# Patient Record
Sex: Female | Born: 2000 | Hispanic: No | Marital: Single | Smoking: Never smoker
Health system: Southern US, Community
[De-identification: ages and names within clinical notes are randomized; demographics above are authoritative.]

## PROBLEM LIST (undated history)

## (undated) DIAGNOSIS — F32A Depression, unspecified: Secondary | ICD-10-CM

## (undated) DIAGNOSIS — F419 Anxiety disorder, unspecified: Secondary | ICD-10-CM

## (undated) DIAGNOSIS — T7840XA Allergy, unspecified, initial encounter: Secondary | ICD-10-CM

## (undated) HISTORY — DX: Allergy, unspecified, initial encounter: T78.40XA

## (undated) HISTORY — DX: Anxiety disorder, unspecified: F41.9

## (undated) HISTORY — DX: Depression, unspecified: F32.A

## (undated) HISTORY — PX: HERNIA REPAIR: SHX51

## (undated) HISTORY — PX: NEPHRECTOMY: SHX65

---
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
## 2022-04-05 DIAGNOSIS — N390 Urinary tract infection, site not specified: Secondary | ICD-10-CM | POA: Diagnosis not present

## 2022-06-09 DIAGNOSIS — N390 Urinary tract infection, site not specified: Secondary | ICD-10-CM | POA: Diagnosis not present

## 2022-06-09 DIAGNOSIS — Z3202 Encounter for pregnancy test, result negative: Secondary | ICD-10-CM | POA: Diagnosis not present

## 2022-06-17 DIAGNOSIS — N390 Urinary tract infection, site not specified: Secondary | ICD-10-CM | POA: Diagnosis not present

## 2022-07-08 ENCOUNTER — Ambulatory Visit: Payer: Self-pay | Admitting: *Deleted

## 2022-07-08 NOTE — Telephone Encounter (Signed)
Per agent: "Pt called saying she would like an appt to discuss digestion issues. She has been having bloating and some diarrhea lately.  I tried to schedule an appt  first available was next week."   CB#  [phone]    Chief Complaint: Diarrhea Symptoms: Reports diarrhea x 3 days over weekend, none past 2 days. Does still have abdominal cramping, 6/10, and bloating.States had UTI 3 weeks ago, completed course of antibiotics. States went to UC, stated "UTI was gone" but urine still malodorous and "Not really pain but uncomfortable when I urinate." Frequency: 5 days Pertinent Negatives: Patient denies  Disposition: '[]'$ ED /'[]'$ Urgent Care (no appt availability in office) / '[x]'$ Appointment(In office/virtual)/ '[]'$  [facility]/ '[]'$ Home Care/ '[]'$ Refused Recommended Disposition /'[]'$ [facility]/ '[]'$  Follow-up with PCP Additional Notes: Care advise provided, verbalizes understanding. Appt secured for tomorrow.    Reason for Disposition  [1] Recent antibiotic therapy (i.e., within last 2 months) AND [2] diarrhea present > 3 days since antibiotic was stopped  Answer Assessment - Initial Assessment Questions 1. DIARRHEA SEVERITY: "How bad is the diarrhea?" "How many more stools have you had in the past 24 hours than normal?"    - NO DIARRHEA (SCALE 0)   - MILD (SCALE 1-3): Few loose or mushy BMs; increase of 1-3 stools over normal daily number of stools; mild increase in ostomy output.   -  MODERATE (SCALE 4-7): Increase of 4-6 stools daily over normal; moderate increase in ostomy output. * SEVERE (SCALE 8-10; OR 'WORST POSSIBLE'): Increase of 7 or more stools daily over normal; moderate increase in ostomy output; incontinence.      2. ONSET: "When did the diarrhea begin?"      Over weekend 3. BM CONSISTENCY: "How loose or watery is the diarrhea?"      *No Answer* 4. VOMITING: "Are you also vomiting?" If Yes, ask: "How many times in the past 24 hours?"       5. ABDOMINAL PAIN: "Are  you having any abdominal pain?" If Yes, ask: "What does it feel like?" (e.g., crampy, dull, intermittent, constant)      Cramping before BMs, gassy. 6. ABDOMINAL PAIN SEVERITY: If present, ask: "How bad is the pain?"  (e.g., Scale 1-10; mild, moderate, or severe)   - MILD (1-3): doesn't interfere with normal activities, abdomen soft and not tender to touch    - MODERATE (4-7): interferes with normal activities or awakens from sleep, abdomen tender to touch    - SEVERE (8-10): excruciating pain, doubled over, unable to do any normal activities       6/10 when occurs 7. ORAL INTAKE: If vomiting, "Have you been able to drink liquids?" "How much liquids have you had in the past 24 hours?"     NA 8. HYDRATION: "Any signs of dehydration?" (e.g., dry mouth [not just dry lips], too weak to stand, dizziness, new weight loss) "When did you last urinate?"     NA 9. EXPOSURE: "Have you traveled to a foreign country recently?" "Have you been exposed to anyone with diarrhea?" "Could you have eaten any food that was spoiled?"     no 10. ANTIBIOTIC USE: "Are you taking antibiotics now or have you taken antibiotics in the past 2 months?"       3 weeks ago, for 1 week. UC said UTI "one." 11. OTHER SYMPTOMS: "Do you have any other symptoms?" (e.g., fever, blood in stool)       "Bloating" Malodorous.  Protocols used: [facility]

## 2022-07-09 ENCOUNTER — Other Ambulatory Visit: Payer: Self-pay | Admitting: Unknown Physician Specialty

## 2022-07-09 ENCOUNTER — Ambulatory Visit (INDEPENDENT_AMBULATORY_CARE_PROVIDER_SITE_OTHER): Payer: Medicaid Other | Admitting: Unknown Physician Specialty

## 2022-07-09 ENCOUNTER — Encounter: Payer: Self-pay | Admitting: Unknown Physician Specialty

## 2022-07-09 VITALS — BP 106/71 | HR 80 | Temp 98.5°F | Ht 61.18 in | Wt 172.0 lb

## 2022-07-09 DIAGNOSIS — R8290 Unspecified abnormal findings in urine: Secondary | ICD-10-CM | POA: Diagnosis not present

## 2022-07-09 DIAGNOSIS — F339 Major depressive disorder, recurrent, unspecified: Secondary | ICD-10-CM | POA: Diagnosis not present

## 2022-07-09 DIAGNOSIS — L700 Acne vulgaris: Secondary | ICD-10-CM

## 2022-07-09 DIAGNOSIS — K5989 Other specified functional intestinal disorders: Secondary | ICD-10-CM

## 2022-07-09 DIAGNOSIS — L709 Acne, unspecified: Secondary | ICD-10-CM | POA: Insufficient documentation

## 2022-07-09 LAB — URINALYSIS, ROUTINE W REFLEX MICROSCOPIC
Bilirubin, UA: NEGATIVE
Glucose, UA: NEGATIVE
Ketones, UA: NEGATIVE
Nitrite, UA: NEGATIVE
Protein,UA: NEGATIVE
RBC, UA: NEGATIVE
Specific Gravity, UA: 1.025 (ref 1.005–1.030)
Urobilinogen, Ur: 0.2 mg/dL (ref 0.2–1.0)
pH, UA: 5.5 (ref 5.0–7.5)

## 2022-07-09 LAB — MICROSCOPIC EXAMINATION: RBC, Urine: NONE SEEN /hpf (ref 0–2)

## 2022-07-09 MED ORDER — CLINDAMYCIN PHOSPHATE 1 % EX LOTN: TOPICAL_LOTION | Freq: Two times a day (BID) | CUTANEOUS | 0 refills | Status: DC

## 2022-07-09 NOTE — Patient Instructions (Signed)
Take Align if bowel problems don't continue to improve

## 2022-07-09 NOTE — Assessment & Plan Note (Signed)
Acne has been a problem for a while and mostly concerned about her back.  Will start Cleocin here and refer to dermatology for further care

## 2022-07-09 NOTE — Assessment & Plan Note (Signed)
Pt states she has an appt with [facility].  Also has a lot of symptoms of hypomania and SSRIs probably not indicated.  Will get a work-up with them and continue to follow
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
## 2022-07-10 ENCOUNTER — Telehealth: Payer: Self-pay

## 2022-07-10 MED ORDER — ADAPALENE 0.3 % EX GEL: 1.0000 | Freq: Every day | CUTANEOUS | 0 refills | Status: DC

## 2022-07-10 NOTE — Telephone Encounter (Signed)
-----   Message from Jon Billings, NP sent at 07/10/2022  3:21 PM EDT ----- Can we check on her clindamycin at the pharmacy?

## 2022-07-10 NOTE — Telephone Encounter (Signed)
Requested medication (s) are due for refill today:   PA denied  Requested medication (s) are on the active medication list:   Yes  Future visit scheduled:   Yes 7/27 with Santiago Glad   Last ordered: 07/09/2022 60 ml, 0 refills by Kathrine Haddock  Returned because no protocol assigned to this medication and the PA was denied.   See note for alternatives from pharmacy.   Requested Prescriptions  Pending Prescriptions Disp Refills   clindamycin (CLEOCIN T) 1 % external solution [Pharmacy Med Name: CLINDAMYCIN PH 1% SOLUTION]  0    Sig: Apply topically 2 (two) times daily.     Off-Protocol Failed - 07/09/2022  6:34 PM      Failed - Medication not assigned to a protocol, review manually.      Passed - Valid encounter within last 12 months    Recent Outpatient Visits           Yesterday Abnormal urine odor   [facility] Kathrine Haddock, NP   1 year ago Difficulty concentrating   [place], NP   2 years ago Chronic midline low back pain with bilateral sciatica   [facility], [place]       Future Appointments             In 2 weeks Jon Billings, NP [facility], [place]

## 2022-07-10 NOTE — Telephone Encounter (Signed)
Spoke with pharmacy staff regarding clindamycin lotion. Pharmacist is saying PA for lotion was denied, is there something else we could send? Pharmacist told me they do not see preferred meds on their end.

## 2022-07-10 NOTE — Telephone Encounter (Signed)
Attemped to call patient regarding new rx sent to pharmacy. Patient did not answer. Unable to lvm due to box being full   OK for PEC/Nurse Triage to give results for patient if he calls back.

## 2022-07-10 NOTE — Telephone Encounter (Signed)
Pt returned call. Message read, verbalizes understanding.

## 2022-07-11 LAB — URINE CULTURE

## 2022-07-15 DIAGNOSIS — F411 Generalized anxiety disorder: Secondary | ICD-10-CM | POA: Diagnosis not present

## 2022-07-24 ENCOUNTER — Encounter: Payer: Self-pay | Admitting: Nurse Practitioner

## 2022-07-24 ENCOUNTER — Ambulatory Visit (INDEPENDENT_AMBULATORY_CARE_PROVIDER_SITE_OTHER): Payer: Medicaid Other | Admitting: Nurse Practitioner

## 2022-07-24 VITALS — BP 121/67 | HR 83 | Temp 99.1°F | Ht 60.83 in | Wt 168.0 lb

## 2022-07-24 DIAGNOSIS — F339 Major depressive disorder, recurrent, unspecified: Secondary | ICD-10-CM

## 2022-07-24 DIAGNOSIS — Z136 Encounter for screening for cardiovascular disorders: Secondary | ICD-10-CM

## 2022-07-24 DIAGNOSIS — F419 Anxiety disorder, unspecified: Secondary | ICD-10-CM

## 2022-07-24 DIAGNOSIS — Z113 Encounter for screening for infections with a predominantly sexual mode of transmission: Secondary | ICD-10-CM | POA: Diagnosis not present

## 2022-07-24 DIAGNOSIS — Z0000 Encounter for general adult medical examination without abnormal findings: Secondary | ICD-10-CM | POA: Diagnosis not present

## 2022-07-24 DIAGNOSIS — Z23 Encounter for immunization: Secondary | ICD-10-CM | POA: Diagnosis not present

## 2022-07-24 LAB — MICROSCOPIC EXAMINATION: RBC, Urine: NONE SEEN /hpf (ref 0–2)

## 2022-07-24 LAB — URINALYSIS, ROUTINE W REFLEX MICROSCOPIC
Bilirubin, UA: NEGATIVE
Glucose, UA: NEGATIVE
Nitrite, UA: NEGATIVE
RBC, UA: NEGATIVE
Specific Gravity, UA: 1.02 (ref 1.005–1.030)
Urobilinogen, Ur: 0.2 mg/dL (ref 0.2–1.0)
pH, UA: 6 (ref 5.0–7.5)

## 2022-07-24 MED ORDER — VALACYCLOVIR HCL 1 G PO TABS: 1000.0000 mg | ORAL_TABLET | Freq: Two times a day (BID) | ORAL | 1 refills | Status: DC

## 2022-07-24 NOTE — Assessment & Plan Note (Signed)
Chronic.  Controlled.  Continue with current medication regimen of Zoloft '25mg'$ .  Labs ordered today.  Return to clinic in 6 months for reevaluation.  Call sooner if concerns arise.

## 2022-07-24 NOTE — Progress Notes (Signed)
BP 121/67   Pulse 83   Temp 99.1 F (37.3 C) (Oral)   Ht 5' 0.83" (1.545 m)   Wt 168 lb (76.2 kg)   SpO2 98%   BMI 31.92 kg/m    Subjective:    Patient ID: Joy Jackson, female    DOB: 09-Feb-2001, 21 y.o.   MRN: [id number]  HPI: Joy Jackson is a 21 y.o. female presenting on 07/24/2022 for comprehensive medical examination. Current medical complaints include:none  She currently lives with: Menopausal Symptoms: no  MOOD Patient states she feels like her mood is much better.  She went to [facility] and was put back on Zoloft which is helping her a lot.  Denies SI.  Depression Screen done today and results listed below:     07/24/2022    2:49 PM 07/09/2022   11:24 AM 07/08/2021    3:55 PM 06/20/2020    3:12 PM  Depression screen PHQ 2/9  Decreased Interest 2 3 0 2  Down, Depressed, Hopeless 0 '3 1 2  '$ PHQ - 2 Score '2 6 1 4  '$ Altered sleeping 0 3  2  Tired, decreased energy '1 2  2  '$ Change in appetite '3 2  2  '$ Feeling bad or failure about yourself  0 3  2  Trouble concentrating 1 3  0  Moving slowly or fidgety/restless 0 3  0  Suicidal thoughts 0 1  0  PHQ-9 Score '7 23  12  '$ Difficult doing work/chores Somewhat difficult Very difficult      The patient does not have a history of falls. I did complete a risk assessment for falls. A plan of care for falls was documented.   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   clindamycin (CLEOCIN-T) 1 % lotion Apply topically 2 (two) times daily.   sertraline (ZOLOFT) 25 MG tablet Take 25 mg by mouth daily.   valACYclovir (VALTREX) 1000 MG tablet Take 1 tablet by mouth 2 (two) times daily.   No current facility-administered medications on file prior to visit.    Allergies:  No Known Allergies  Social History:  Social History   Socioeconomic History   Marital status: Single    Spouse name: Not on file   Number of children: Not on file   Years of education: Not on file   Highest education level: Not on file  Occupational History   Not on file  Tobacco Use   Smoking status: Never   Smokeless tobacco: Never   Tobacco comments:    mother vapes  Vaping Use   Vaping Use: Every day  Substance and Sexual Activity   Alcohol use: Not Currently   Drug use: Yes    Types: Marijuana   Sexual activity: Yes  Birth control/protection: I.U.D.  Other Topics Concern   Not on file  Social History Narrative   Not on file   Social Determinants of Health   Financial Resource Strain: Not on file  Food Insecurity: Not on file  Transportation Needs: Not on file  Physical Activity: Not on file  Stress: Not on file  Social Connections: Not on file  Intimate Partner Violence: Not on file   Social History   Tobacco Use  Smoking Status Never  Smokeless Tobacco Never  Tobacco Comments   mother vapes   Social History   Substance and Sexual Activity  Alcohol Use Not Currently    Family History:  Family History  Problem Relation Age of Onset   Healthy Mother    Other Father        unknown medical history   ADD / ADHD Brother    Asthma Brother    Heart attack Maternal Grandfather     Past medical history, surgical history, medications, allergies, family history and social history reviewed with patient today and changes made to appropriate areas of the chart.    Review of Systems  Psychiatric/Behavioral:  Positive for depression. Negative for suicidal ideas. The patient is nervous/anxious.    All other ROS negative except what is listed above and in the HPI.      Objective:    BP 121/67   Pulse 83   Temp 99.1 F (37.3 C) (Oral)   Ht 5' 0.83" (1.545 m)   Wt 168 lb (76.2 kg)   SpO2 98%   BMI 31.92 kg/m   Wt Readings from Last 3 Encounters:  07/24/22 168 lb (76.2 kg) (91 %, Z= 1.36)*  07/09/22 172 lb (78 kg) (93 %, Z= 1.45)*  07/08/21 160 lb 4 oz (72.7 kg) (89 %, Z= 1.25)*   * Growth percentiles are based on CDC (Girls, 2-20 Years) data.    Physical Exam Vitals and nursing note reviewed.  Constitutional:      General: She is awake. She is not in acute distress.    Appearance: Normal appearance. She is well-developed and normal weight. She is not ill-appearing.  HENT:     Head: Normocephalic and atraumatic.     Right Ear: Hearing, tympanic membrane, ear canal and external ear normal. No drainage.     Left Ear: Hearing, tympanic membrane, ear canal and external ear normal. No drainage.     Nose: Nose normal.     Right Sinus: No maxillary sinus tenderness or frontal sinus tenderness.     Left Sinus: No maxillary sinus tenderness or frontal sinus tenderness.     Mouth/Throat:     Mouth: Mucous membranes are moist.     Pharynx: Oropharynx is clear. Uvula midline. No pharyngeal swelling, oropharyngeal exudate or posterior oropharyngeal erythema.  Eyes:     General: Lids are normal.        Right eye: No discharge.        Left eye: No discharge.     Extraocular Movements: Extraocular movements intact.     Conjunctiva/sclera: Conjunctivae normal.     Pupils: Pupils are equal, round, and reactive to light.     Visual Fields: Right eye visual fields normal and left eye visual fields normal.  Neck:     Thyroid: No thyromegaly.     Vascular: No carotid bruit.     Trachea: Trachea normal.  Cardiovascular:     Rate and Rhythm: Normal  rate and regular rhythm.  Heart sounds: Normal heart sounds. No murmur heard.    No gallop.  Pulmonary:     Effort: Pulmonary effort is normal. No accessory muscle usage or respiratory distress.     Breath sounds: Normal breath sounds.  Chest:  Breasts:    Right: Normal.     Left: Normal.  Abdominal:     General: Bowel sounds are normal.     Palpations: Abdomen is soft. There is no hepatomegaly or splenomegaly.     Tenderness: There is no abdominal tenderness.  Musculoskeletal:        General: Normal range of motion.     Cervical back: Normal range of motion and neck supple.     Right lower leg: No edema.     Left lower leg: No edema.  Lymphadenopathy:     Head:     Right side of head: No submental, submandibular, tonsillar, preauricular or posterior auricular adenopathy.     Left side of head: No submental, submandibular, tonsillar, preauricular or posterior auricular adenopathy.     Cervical: No cervical adenopathy.     Upper Body:     Right upper body: No supraclavicular, axillary or pectoral adenopathy.     Left upper body: No supraclavicular, axillary or pectoral adenopathy.  Skin:    General: Skin is warm and dry.     Capillary Refill: Capillary refill takes less than 2 seconds.     Findings: No rash.  Neurological:     Mental Status: She is alert and oriented to person, place, and time.     Gait: Gait is intact.     Deep Tendon Reflexes: Reflexes are normal and symmetric.     Reflex Scores:      Brachioradialis reflexes are 2+ on the right side and 2+ on the left side.      Patellar reflexes are 2+ on the right side and 2+ on the left side. Psychiatric:        Attention and Perception: Attention normal.        Mood and Affect: Mood normal.        Speech: Speech normal.        Behavior: Behavior normal. Behavior is cooperative.        Thought Content: Thought content normal.        Judgment: Judgment normal.     Results for orders placed or performed in visit  on 07/09/22  Urine Culture   Specimen: Urine   UR  Result Value Ref Range   Urine Culture, Routine Final report    Organism ID, Bacteria Comment   Microscopic Examination   Urine  Result Value Ref Range   WBC, UA 0-5 0 - 5 /hpf   RBC, Urine None seen 0 - 2 /hpf   Epithelial Cells (non renal) 0-10 0 - 10 /hpf   Mucus, UA Present (A) Not Estab.   Bacteria, UA Moderate (A) None seen/Few  Urinalysis, Routine w reflex microscopic  Result Value Ref Range   Specific Gravity, UA 1.025 1.005 - 1.030   pH, UA 5.5 5.0 - 7.5   Color, UA Yellow Yellow   Appearance Ur Cloudy (A) Clear   Leukocytes,UA Trace (A) Negative   Protein,UA Negative Negative/Trace   Glucose, UA Negative Negative   Ketones, UA Negative Negative   RBC, UA Negative Negative   Bilirubin, UA Negative Negative   Urobilinogen, Ur 0.2 0.2 - 1.0 mg/dL   Nitrite, UA Negative Negative   Microscopic Examination See below:  Assessment & Plan:   Problem List Items Addressed This Visit       Other   Anxiety    Chronic.  Controlled.  Continue with current medication regimen of Zoloft '25mg'$ .  Labs ordered today.  Return to clinic in 6 months for reevaluation.  Call sooner if concerns arise.        Relevant Medications   sertraline (ZOLOFT) 25 MG tablet   Depression, recurrent (HCC)    Chronic.  Controlled.  Continue with current medication regimen of Zoloft '25mg'$ .  Labs ordered today.  Return to clinic in 6 months for reevaluation.  Call sooner if concerns arise.        Relevant Medications   sertraline (ZOLOFT) 25 MG tablet   Other Visit Diagnoses     Annual physical exam    -  Primary   Health maintenance reviewed during visit today.  Labs ordered. HPV given.    Relevant Orders   CBC with Differential/Platelet   Comprehensive metabolic panel   Lipid panel   TSH   Urinalysis, Routine w reflex microscopic   Screening for ischemic heart disease       Relevant Orders   Lipid panel   Need for HPV  vaccination       Relevant Orders   HPV 9-valent vaccine,Recombinat   Routine screening for STI (sexually transmitted infection)       Relevant Orders   HIV Antibody (routine testing w rflx)   Hepatitis C Antibody   Chlamydia/Gonococcus/Trichomonas, NAA([facility])        Follow up plan: No follow-ups on file.   LABORATORY TESTING:  - Pap smear: not applicable  IMMUNIZATIONS:   - Tdap: Tetanus vaccination status reviewed: last tetanus booster within 10 years. - Influenza: Postponed to flu season - Pneumovax: Not applicable - Prevnar: Not applicable - COVID: Not applicable - HPV: Administered today - Shingrix vaccine: Not applicable  SCREENING: -Mammogram: Not applicable  - Colonoscopy: Not applicable  - Bone Density: Not applicable  -Hearing Test: Not applicable  -Spirometry: Not applicable   PATIENT COUNSELING:   Advised to take 1 mg of folate supplement per day if capable of pregnancy.   Sexuality: Discussed sexually transmitted diseases, partner selection, use of condoms, avoidance of unintended pregnancy  and contraceptive alternatives.   Advised to avoid cigarette smoking.  I discussed with the patient that most people either abstain from alcohol or drink within safe limits (<=14/week and <=4 drinks/occasion for males, <=7/weeks and <= 3 drinks/occasion for females) and that the risk for alcohol disorders and other health effects rises proportionally with the number of drinks per week and how often a drinker exceeds daily limits.  Discussed cessation/primary prevention of drug use and availability of treatment for abuse.   Diet: Encouraged to adjust caloric intake to maintain  or achieve ideal body weight, to reduce intake of dietary saturated fat and total fat, to limit sodium intake by avoiding high sodium foods and not adding table salt, and to maintain adequate dietary potassium and calcium preferably from fresh fruits, vegetables, and low-fat dairy products.     stressed the importance of regular exercise  Injury prevention: Discussed safety belts, safety helmets, smoke detector, smoking near bedding or upholstery.   Dental health: Discussed importance of regular tooth brushing, flossing, and dental visits.    NEXT PREVENTATIVE PHYSICAL DUE IN 1 YEAR. No follow-ups on file.

## 2022-07-25 LAB — COMPREHENSIVE METABOLIC PANEL
ALT: 7 IU/L (ref 0–32)
AST: 16 IU/L (ref 0–40)
Albumin/Globulin Ratio: 2 (ref 1.2–2.2)
Albumin: 4.6 g/dL (ref 4.0–5.0)
Alkaline Phosphatase: 77 IU/L (ref 42–106)
BUN/Creatinine Ratio: 11 (ref 9–23)
BUN: 10 mg/dL (ref 6–20)
Bilirubin Total: 0.4 mg/dL (ref 0.0–1.2)
CO2: 22 mmol/L (ref 20–29)
Calcium: 9.4 mg/dL (ref 8.7–10.2)
Chloride: 103 mmol/L (ref 96–106)
Creatinine, Ser: 0.93 mg/dL (ref 0.57–1.00)
Globulin, Total: 2.3 g/dL (ref 1.5–4.5)
Glucose: 78 mg/dL (ref 70–99)
Potassium: 3.8 mmol/L (ref 3.5–5.2)
Sodium: 141 mmol/L (ref 134–144)
Total Protein: 6.9 g/dL (ref 6.0–8.5)
eGFR: 91 mL/min/{1.73_m2} (ref >59)

## 2022-07-25 LAB — LIPID PANEL
Chol/HDL Ratio: 1.7 ratio (ref 0.0–4.4)
Cholesterol, Total: 123 mg/dL (ref 100–169)
HDL: 73 mg/dL (ref >39)
LDL Chol Calc (NIH): 36 mg/dL (ref 0–109)
Triglycerides: 69 mg/dL (ref 0–89)
VLDL Cholesterol Cal: 14 mg/dL (ref 5–40)

## 2022-07-25 LAB — CBC WITH DIFFERENTIAL/PLATELET
Basophils Absolute: 0.1 10*3/uL (ref 0.0–0.2)
Basos: 1 %
EOS (ABSOLUTE): 0.1 10*3/uL (ref 0.0–0.4)
Eos: 2 %
Hematocrit: 37.3 % (ref 34.0–46.6)
Hemoglobin: 12.7 g/dL (ref 11.1–15.9)
Immature Grans (Abs): 0 10*3/uL (ref 0.0–0.1)
Immature Granulocytes: 0 %
Lymphocytes Absolute: 2.1 10*3/uL (ref 0.7–3.1)
Lymphs: 28 %
MCH: 30 pg (ref 26.6–33.0)
MCHC: 34 g/dL (ref 31.5–35.7)
MCV: 88 fL (ref 79–97)
Monocytes Absolute: 0.6 10*3/uL (ref 0.1–0.9)
Monocytes: 8 %
Neutrophils Absolute: 4.5 10*3/uL (ref 1.4–7.0)
Neutrophils: 61 %
Platelets: 322 10*3/uL (ref 150–450)
RBC: 4.23 x10E6/uL (ref 3.77–5.28)
RDW: 12.5 % (ref 11.7–15.4)
WBC: 7.3 10*3/uL (ref 3.4–10.8)

## 2022-07-25 LAB — TSH: TSH: 1.76 u[IU]/mL (ref 0.450–4.500)

## 2022-07-25 LAB — HEPATITIS C ANTIBODY: Hep C Virus Ab: NONREACTIVE

## 2022-07-25 LAB — HIV ANTIBODY (ROUTINE TESTING W REFLEX): HIV Screen 4th Generation wRfx: NONREACTIVE

## 2022-07-25 NOTE — Progress Notes (Signed)
Hi Joniqua. It was nice to see you yesterday.  Your lab work looks good.  No concerns at this time. Continue with your current medication regimen.  Follow up as discussed.  Please let me know if you have any questions.

## 2022-07-28 LAB — CHLAMYDIA/GONOCOCCUS/TRICHOMONAS, NAA
Chlamydia by NAA: NEGATIVE
Gonococcus by NAA: NEGATIVE
Trich vag by NAA: NEGATIVE

## 2022-07-29 ENCOUNTER — Encounter: Payer: Self-pay | Admitting: Nurse Practitioner

## 2022-08-28 DIAGNOSIS — F411 Generalized anxiety disorder: Secondary | ICD-10-CM | POA: Diagnosis not present

## 2022-08-31 DIAGNOSIS — N898 Other specified noninflammatory disorders of vagina: Secondary | ICD-10-CM | POA: Diagnosis not present

## 2022-08-31 DIAGNOSIS — N760 Acute vaginitis: Secondary | ICD-10-CM | POA: Diagnosis not present

## 2022-10-27 DIAGNOSIS — F3341 Major depressive disorder, recurrent, in partial remission: Secondary | ICD-10-CM | POA: Diagnosis not present

## 2022-10-27 DIAGNOSIS — F411 Generalized anxiety disorder: Secondary | ICD-10-CM | POA: Diagnosis not present

## 2022-11-24 DIAGNOSIS — N941 Unspecified dyspareunia: Secondary | ICD-10-CM | POA: Diagnosis not present

## 2022-11-24 DIAGNOSIS — R300 Dysuria: Secondary | ICD-10-CM | POA: Diagnosis not present

## 2022-11-24 DIAGNOSIS — F419 Anxiety disorder, unspecified: Secondary | ICD-10-CM | POA: Diagnosis not present

## 2022-11-24 DIAGNOSIS — J45998 Other asthma: Secondary | ICD-10-CM | POA: Diagnosis not present

## 2022-12-16 ENCOUNTER — Ambulatory Visit: Payer: Self-pay

## 2022-12-16 NOTE — Telephone Encounter (Signed)
  Chief Complaint: Requesting medication Symptoms: Sore throat Frequency: last night Pertinent Negatives: Patient denies any other s/s Disposition: '[]'$ ED /'[]'$ Urgent Care (no appt availability in office) / '[]'$ Appointment(In office/virtual)/ '[]'$  [place] [facility]/ '[]'$ Home Care/ '[]'$ Refused Recommended Disposition /'[]'$ [place] [facility]/ '[x]'$  Follow-up with PCP Additional Notes: Pt states that her mother tested + for flu at [facility] and was given Tamiflu.  Pt  and brother currently have sore throats. Both would like to get an Rx called in for her and her brother as they are going out of town.   Please advise.    Summary: requesting Tamaflu   Patients mother Lavell Ridings states that she just left urgent care and she tested positive for the flu and the Urgent Care doctor told her to call her PCP for her to order Tamaflu for the patient. Patient is experiencing cough and sore throat. Please advise     Reason for Disposition  Prescription request for new medicine (not a refill)  Answer Assessment - Initial Assessment Questions 1. DRUG NAME: "What medicine do you need to have refilled?"     Tamiflu 2. REFILLS REMAINING: "How many refills are remaining?" (Note: The label on the medicine or pill bottle will show how many refills are remaining. If there are no refills remaining, then a renewal may be needed.)     no 3. EXPIRATION DATE: "What is the expiration date?" (Note: The label states when the prescription will expire, and thus can no longer be refilled.)     no 4. PRESCRIBING HCP: "Who prescribed it?" Reason: If prescribed by specialist, call should be referred to that group.     no 5. SYMPTOMS: "Do you have any symptoms?"     Sore throat 6. PREGNANCY: "Is there any chance that you are pregnant?" "When was your last menstrual period?"  Protocols used: Medication Refill and Renewal Call-A-AH

## 2022-12-16 NOTE — Telephone Encounter (Signed)
Spoke with patient and let her know that an appointment is needed for medications. She verbalized understanding and stated she thinks they will be okay

## 2022-12-18 ENCOUNTER — Telehealth (INDEPENDENT_AMBULATORY_CARE_PROVIDER_SITE_OTHER): Payer: Self-pay | Admitting: Physician Assistant

## 2022-12-18 DIAGNOSIS — Z91199 Patient's noncompliance with other medical treatment and regimen due to unspecified reason: Secondary | ICD-10-CM

## 2022-12-19 DIAGNOSIS — J069 Acute upper respiratory infection, unspecified: Secondary | ICD-10-CM | POA: Diagnosis not present

## 2022-12-19 DIAGNOSIS — R051 Acute cough: Secondary | ICD-10-CM | POA: Diagnosis not present

## 2022-12-24 NOTE — Progress Notes (Signed)
Patient cancelled apt same day

## 2022-12-30 ENCOUNTER — Ambulatory Visit: Payer: Medicaid Other | Admitting: Nurse Practitioner

## 2022-12-30 NOTE — Progress Notes (Deleted)
There were no vitals taken for this visit.   Subjective:    Patient ID: Joy Jackson, female    DOB: 29-Aug-2001, 22 y.o.   MRN: [id number]  HPI: Joy Jackson is a 22 y.o. female  No chief complaint on file.  DEPRESSION/ANXIETY   Relevant past medical, surgical, family and social history reviewed and updated as indicated. Interim medical history since our last visit reviewed. Allergies and medications reviewed and updated.  Review of Systems  Per HPI unless specifically indicated above     Objective:    There were no vitals taken for this visit.  Wt Readings from Last 3 Encounters:  07/24/22 168 lb (76.2 kg) (91 %, Z= 1.36)*  07/09/22 172 lb (78 kg) (93 %, Z= 1.45)*  07/08/21 160 lb 4 oz (72.7 kg) (89 %, Z= 1.25)*   * Growth percentiles are based on CDC (Girls, 2-20 Years) data.    Physical Exam  Results for orders placed or performed in visit on 07/24/22  Chlamydia/Gonococcus/Trichomonas, NAA([facility])   Specimen: Urine   UR  Result Value Ref Range   Chlamydia by NAA Negative Negative   Gonococcus by NAA Negative Negative   Trich vag by NAA Negative Negative  Microscopic Examination   Urine  Result Value Ref Range   WBC, UA 0-5 0 - 5 /hpf   RBC, Urine None seen 0 - 2 /hpf   Epithelial Cells (non renal) 0-10 0 - 10 /hpf   Mucus, UA Present (A) Not Estab.   Bacteria, UA Many (A) None seen/Few  CBC with Differential/Platelet  Result Value Ref Range   WBC 7.3 3.4 - 10.8 x10E3/uL   RBC 4.23 3.77 - 5.28 x10E6/uL   Hemoglobin 12.7 11.1 - 15.9 g/dL   Hematocrit 37.3 34.0 - 46.6 %   MCV 88 79 - 97 fL   MCH 30.0 26.6 - 33.0 pg   MCHC 34.0 31.5 - 35.7 g/dL   RDW 12.5 11.7 - 15.4 %   Platelets 322 150 - 450 x10E3/uL   Neutrophils 61 Not Estab. %   Lymphs 28 Not Estab. %   Monocytes 8 Not Estab. %   Eos 2 Not Estab. %   Basos 1 Not Estab. %   Neutrophils Absolute 4.5 1.4 - 7.0 x10E3/uL   Lymphocytes Absolute 2.1 0.7 - 3.1 x10E3/uL   Monocytes Absolute 0.6  0.1 - 0.9 x10E3/uL   EOS (ABSOLUTE) 0.1 0.0 - 0.4 x10E3/uL   Basophils Absolute 0.1 0.0 - 0.2 x10E3/uL   Immature Granulocytes 0 Not Estab. %   Immature Grans (Abs) 0.0 0.0 - 0.1 x10E3/uL  Comprehensive metabolic panel  Result Value Ref Range   Glucose 78 70 - 99 mg/dL   BUN 10 6 - 20 mg/dL   Creatinine, Ser 0.93 0.57 - 1.00 mg/dL   eGFR 91 >59 mL/min/1.73   BUN/Creatinine Ratio 11 9 - 23   Sodium 141 134 - 144 mmol/L   Potassium 3.8 3.5 - 5.2 mmol/L   Chloride 103 96 - 106 mmol/L   CO2 22 20 - 29 mmol/L   Calcium 9.4 8.7 - 10.2 mg/dL   Total Protein 6.9 6.0 - 8.5 g/dL   Albumin 4.6 4.0 - 5.0 g/dL   Globulin, Total 2.3 1.5 - 4.5 g/dL   Albumin/Globulin Ratio 2.0 1.2 - 2.2   Bilirubin Total 0.4 0.0 - 1.2 mg/dL   Alkaline Phosphatase 77 42 - 106 IU/L   AST 16 0 - 40 IU/L   ALT 7  0 - 32 IU/L  Lipid panel  Result Value Ref Range   Cholesterol, Total 123 100 - 169 mg/dL   Triglycerides 69 0 - 89 mg/dL   HDL 73 >39 mg/dL   VLDL Cholesterol Cal 14 5 - 40 mg/dL   LDL Chol Calc (NIH) 36 0 - 109 mg/dL   Chol/HDL Ratio 1.7 0.0 - 4.4 ratio  TSH  Result Value Ref Range   TSH 1.760 0.450 - 4.500 uIU/mL  Urinalysis, Routine w reflex microscopic  Result Value Ref Range   Specific Gravity, UA 1.020 1.005 - 1.030   pH, UA 6.0 5.0 - 7.5   Color, UA Yellow Yellow   Appearance Ur Cloudy (A) Clear   Leukocytes,UA Trace (A) Negative   Protein,UA 1+ (A) Negative/Trace   Glucose, UA Negative Negative   Ketones, UA Trace (A) Negative   RBC, UA Negative Negative   Bilirubin, UA Negative Negative   Urobilinogen, Ur 0.2 0.2 - 1.0 mg/dL   Nitrite, UA Negative Negative   Microscopic Examination See below:   HIV Antibody (routine testing w rflx)  Result Value Ref Range   HIV Screen 4th Generation wRfx Non Reactive Non Reactive  Hepatitis C Antibody  Result Value Ref Range   Hep C Virus Ab Non Reactive Non Reactive      Assessment & Plan:   Problem List Items Addressed This Visit        Other   Anxiety   Depression, recurrent (HCC) - Primary     Follow up plan: No follow-ups on file.

## 2023-01-11 DIAGNOSIS — F419 Anxiety disorder, unspecified: Secondary | ICD-10-CM | POA: Diagnosis not present

## 2023-01-11 DIAGNOSIS — N760 Acute vaginitis: Secondary | ICD-10-CM | POA: Diagnosis not present

## 2023-01-19 ENCOUNTER — Telehealth: Payer: Medicaid Other | Admitting: Nurse Practitioner

## 2023-01-19 ENCOUNTER — Encounter: Payer: Self-pay | Admitting: Nurse Practitioner

## 2023-01-19 NOTE — Progress Notes (Signed)
Patient did not login to video for appointment.

## 2023-03-26 ENCOUNTER — Other Ambulatory Visit: Payer: Self-pay | Admitting: Nurse Practitioner

## 2023-03-27 NOTE — Telephone Encounter (Signed)
Patient will need an office visit for further refills. Requested Prescriptions  Pending Prescriptions Disp Refills   valACYclovir (VALTREX) 1000 MG tablet [Pharmacy Med Name: VALACYCLOVIR HCL 1 GRAM TABLET] 180 tablet 0    Sig: TAKE 1 TABLET BY MOUTH TWICE A DAY     Antimicrobials:  Antiviral Agents - Anti-Herpetic Passed - 03/26/2023  4:34 PM      Passed - Valid encounter within last 12 months    Recent Outpatient Visits           2 months ago Erroneous encounter - disregard   [place], NP   3 months ago Failure to attend appointment   [place], Dani Gobble, PA-C   8 months ago Annual physical exam   [place], NP   8 months ago Abnormal urine odor   [place] Kathrine Haddock, NP   1 year ago Difficulty concentrating   [place] Jon Billings, NP

## 2023-05-18 ENCOUNTER — Telehealth: Payer: Medicaid Other | Admitting: Nurse Practitioner

## 2023-05-18 NOTE — Progress Notes (Deleted)
There were no vitals taken for this visit.   Subjective:    Patient ID: Dayna Ramus, female    DOB: 13-Sep-2001, 22 y.o.   MRN: [id number]  HPI: DHRITI SOLOMONS is a 22 y.o. female  No chief complaint on file.   Relevant past medical, surgical, family and social history reviewed and updated as indicated. Interim medical history since our last visit reviewed. Allergies and medications reviewed and updated.  Review of Systems  Per HPI unless specifically indicated above     Objective:    There were no vitals taken for this visit.  Wt Readings from Last 3 Encounters:  07/24/22 168 lb (76.2 kg) (91 %, Z= 1.36)*  07/09/22 172 lb (78 kg) (93 %, Z= 1.45)*  07/08/21 160 lb 4 oz (72.7 kg) (89 %, Z= 1.25)*   * Growth percentiles are based on CDC (Girls, 2-20 Years) data.    Physical Exam  Results for orders placed or performed in visit on 07/24/22  Chlamydia/Gonococcus/Trichomonas, NAA([facility])   Specimen: Urine   UR  Result Value Ref Range   Chlamydia by NAA Negative Negative   Gonococcus by NAA Negative Negative   Trich vag by NAA Negative Negative  Microscopic Examination   Urine  Result Value Ref Range   WBC, UA 0-5 0 - 5 /hpf   RBC, Urine None seen 0 - 2 /hpf   Epithelial Cells (non renal) 0-10 0 - 10 /hpf   Mucus, UA Present (A) Not Estab.   Bacteria, UA Many (A) None seen/Few  CBC with Differential/Platelet  Result Value Ref Range   WBC 7.3 3.4 - 10.8 x10E3/uL   RBC 4.23 3.77 - 5.28 x10E6/uL   Hemoglobin 12.7 11.1 - 15.9 g/dL   Hematocrit 40.9 81.1 - 46.6 %   MCV 88 79 - 97 fL   MCH 30.0 26.6 - 33.0 pg   MCHC 34.0 31.5 - 35.7 g/dL   RDW 91.4 78.2 - 95.6 %   Platelets 322 150 - 450 x10E3/uL   Neutrophils 61 Not Estab. %   Lymphs 28 Not Estab. %   Monocytes 8 Not Estab. %   Eos 2 Not Estab. %   Basos 1 Not Estab. %   Neutrophils Absolute 4.5 1.4 - 7.0 x10E3/uL   Lymphocytes Absolute 2.1 0.7 - 3.1 x10E3/uL   Monocytes Absolute 0.6 0.1 - 0.9 x10E3/uL    EOS (ABSOLUTE) 0.1 0.0 - 0.4 x10E3/uL   Basophils Absolute 0.1 0.0 - 0.2 x10E3/uL   Immature Granulocytes 0 Not Estab. %   Immature Grans (Abs) 0.0 0.0 - 0.1 x10E3/uL  Comprehensive metabolic panel  Result Value Ref Range   Glucose 78 70 - 99 mg/dL   BUN 10 6 - 20 mg/dL   Creatinine, Ser 2.13 0.57 - 1.00 mg/dL   eGFR 91 >08 MV/HQI/6.96   BUN/Creatinine Ratio 11 9 - 23   Sodium 141 134 - 144 mmol/L   Potassium 3.8 3.5 - 5.2 mmol/L   Chloride 103 96 - 106 mmol/L   CO2 22 20 - 29 mmol/L   Calcium 9.4 8.7 - 10.2 mg/dL   Total Protein 6.9 6.0 - 8.5 g/dL   Albumin 4.6 4.0 - 5.0 g/dL   Globulin, Total 2.3 1.5 - 4.5 g/dL   Albumin/Globulin Ratio 2.0 1.2 - 2.2   Bilirubin Total 0.4 0.0 - 1.2 mg/dL   Alkaline Phosphatase 77 42 - 106 IU/L   AST 16 0 - 40 IU/L   ALT 7 0 -  32 IU/L  Lipid panel  Result Value Ref Range   Cholesterol, Total 123 100 - 169 mg/dL   Triglycerides 69 0 - 89 mg/dL   HDL 73 >16 mg/dL   VLDL Cholesterol Cal 14 5 - 40 mg/dL   LDL Chol Calc (NIH) 36 0 - 109 mg/dL   Chol/HDL Ratio 1.7 0.0 - 4.4 ratio  TSH  Result Value Ref Range   TSH 1.760 0.450 - 4.500 uIU/mL  Urinalysis, Routine w reflex microscopic  Result Value Ref Range   Specific Gravity, UA 1.020 1.005 - 1.030   pH, UA 6.0 5.0 - 7.5   Color, UA Yellow Yellow   Appearance Ur Cloudy (A) Clear   Leukocytes,UA Trace (A) Negative   Protein,UA 1+ (A) Negative/Trace   Glucose, UA Negative Negative   Ketones, UA Trace (A) Negative   RBC, UA Negative Negative   Bilirubin, UA Negative Negative   Urobilinogen, Ur 0.2 0.2 - 1.0 mg/dL   Nitrite, UA Negative Negative   Microscopic Examination See below:   HIV Antibody (routine testing w rflx)  Result Value Ref Range   HIV Screen 4th Generation wRfx Non Reactive Non Reactive  Hepatitis C Antibody  Result Value Ref Range   Hep C Virus Ab Non Reactive Non Reactive      Assessment & Plan:   Problem List Items Addressed This Visit   None    Follow up  plan: No follow-ups on file.

## 2023-06-06 ENCOUNTER — Telehealth: Payer: Medicaid Other | Admitting: Nurse Practitioner

## 2023-06-06 DIAGNOSIS — M542 Cervicalgia: Secondary | ICD-10-CM | POA: Diagnosis not present

## 2023-06-06 DIAGNOSIS — G43009 Migraine without aura, not intractable, without status migrainosus: Secondary | ICD-10-CM | POA: Diagnosis not present

## 2023-06-06 MED ORDER — NAPROXEN 500 MG PO TABS: 500.0000 mg | ORAL_TABLET | Freq: Two times a day (BID) | ORAL | 1 refills | Status: DC

## 2023-06-06 MED ORDER — CYCLOBENZAPRINE HCL 10 MG PO TABS: 10.0000 mg | ORAL_TABLET | Freq: Three times a day (TID) | ORAL | 1 refills | Status: DC | PRN

## 2023-06-06 NOTE — Patient Instructions (Signed)
  Joy Jackson, thank you for joining Bennie Pierini, FNP for today's virtual visit.  While this provider is not your primary care provider (PCP), if your PCP is located in our provider database this encounter information will be shared with them immediately following your visit.   A [facility] MyChart account gives you access to today's visit and all your visits, tests, and labs performed at [facility] " click here if you don't have a [facility] MyChart account or go to [email or web address]  Consent: (Patient) Joy Jackson provided verbal consent for this virtual visit at the beginning of the encounter.  Current Medications:  Current Outpatient Medications:    cyclobenzaprine (FLEXERIL) 10 MG tablet, Take 1 tablet (10 mg total) by mouth 3 (three) times daily as needed for muscle spasms., Disp: 30 tablet, Rfl: 1   naproxen (NAPROSYN) 500 MG tablet, Take 1 tablet (500 mg total) by mouth 2 (two) times daily with a meal., Disp: 60 tablet, Rfl: 1   sertraline (ZOLOFT) 50 MG tablet, Take 50 mg by mouth daily., Disp: , Rfl:    valACYclovir (VALTREX) 1000 MG tablet, TAKE 1 TABLET BY MOUTH TWICE A DAY, Disp: 180 tablet, Rfl: 0   Medications ordered in this encounter:  Meds ordered this encounter  Medications   naproxen (NAPROSYN) 500 MG tablet    Sig: Take 1 tablet (500 mg total) by mouth 2 (two) times daily with a meal.    Dispense:  60 tablet    Refill:  1    Order Specific Question:   Supervising Provider    Answer:   Merrilee Jansky [[id number]]   cyclobenzaprine (FLEXERIL) 10 MG tablet    Sig: Take 1 tablet (10 mg total) by mouth 3 (three) times daily as needed for muscle spasms.    Dispense:  30 tablet    Refill:  1    Order Specific Question:   Supervising Provider    Answer:   Merrilee Jansky [id number]     *If you need refills on other medications prior to your next appointment, please contact your pharmacy*  Follow-Up: Call back or seek an in-person  evaluation if the symptoms worsen or if the condition fails to improve as anticipated.  [facility] [phone]  Other Instructions 1. Migraine without aura and without status migrainosus, not intractable Keep diary of migraines Rest Avoid caffeine  2. Cervical pain (neck) Moist heat rest Stretching exercises Need to see PCP for further workup Flexeril with sedation precautions   If you have been instructed to have an in-person evaluation today at a local Urgent Care facility, please use the link below. It will take you to a list of all of our available [facility], including address, phone number and hours of operation. Please do not delay care.  [facility]  If you or a family member do not have a primary care provider, use the link below to schedule a visit and establish care. When you choose a [facility] primary care physician or advanced practice provider, you gain a long-term partner in health. Find a Primary Care Provider  Learn more about [facility] in-office and virtual care options: [facility] - Get Care Now

## 2023-06-06 NOTE — Progress Notes (Signed)
Virtual Visit Consent   Joy Jackson, you are scheduled for a virtual visit with Mary-Margaret Daphine Deutscher, FNP, a [facility] provider, today.     Just as with appointments in the office, your consent must be obtained to participate.  Your consent will be active for this visit and any virtual visit you may have with one of our providers in the next 365 days.     If you have a MyChart account, a copy of this consent can be sent to you electronically.  All virtual visits are billed to your insurance company just like a traditional visit in the office.    As this is a virtual visit, video technology does not allow for your provider to perform a traditional examination.  This may limit your provider's ability to fully assess your condition.  If your provider identifies any concerns that need to be evaluated in person or the need to arrange testing (such as labs, EKG, etc.), we will make arrangements to do so.     Although advances in technology are sophisticated, we cannot ensure that it will always work on either your end or our end.  If the connection with a video visit is poor, the visit may have to be switched to a telephone visit.  With either a video or telephone visit, we are not always able to ensure that we have a secure connection.     I need to obtain your verbal consent now.   Are you willing to proceed with your visit today? YES   Joy Jackson has provided verbal consent on 06/06/2023 for a virtual visit (video or telephone).   Mary-Margaret Daphine Deutscher, FNP   Date: 06/06/2023 7:24 PM   Virtual Visit via Video Note   I, Mary-Margaret Daphine Deutscher, connected with Joy Jackson ([id number], [birth year]) on 06/06/23 at  7:45 PM EDT by a video-enabled telemedicine application and verified that I am speaking with the correct person using two identifiers.  Location: Patient: Virtual Visit Location Patient: Home Provider: Virtual Visit Location Provider: Mobile   I discussed the limitations of  evaluation and management by telemedicine and the availability of in person appointments. The patient expressed understanding and agreed to proceed.    History of Present Illness: Joy Jackson is a 22 y.o. who identifies as a female who was assigned female at birth, and is being seen today for neck pain and migraine.  HPI: Patient states that she started having migraines for 3 weeks . Has been having daily. Rates pain 8/10. Has been taking  excedrin helps most of the time. She started having neck pain about 2 1/2 weeks ago.ain is only when she is turning her head. Describes neck pain as a soreness, rated 6/10. Excedrin does not help the neck pain. Denies numbness tingling down arms. No visual disturbances. Denies over use of caffeine. Slight nausea occasionally but no vomiting.    Review of Systems  Constitutional:  Negative for diaphoresis and weight loss.  Eyes:  Negative for blurred vision, double vision and pain.  Respiratory:  Negative for shortness of breath.   Cardiovascular:  Negative for chest pain, palpitations, orthopnea and leg swelling.  Gastrointestinal:  Negative for abdominal pain.  Musculoskeletal:  Positive for neck pain.  Skin:  Negative for rash.  Neurological:  Positive for headaches. Negative for dizziness, sensory change, loss of consciousness and weakness.  Endo/Heme/Allergies:  Negative for polydipsia. Does not bruise/bleed easily.  Psychiatric/Behavioral:  Negative for memory loss. The patient does  not have insomnia.   All other systems reviewed and are negative.   Problems:  Patient Active Problem List   Diagnosis Date Noted   Acne 07/09/2022   Depression, recurrent (HCC) 07/09/2022   Anxiety 07/09/2021   GERD (gastroesophageal reflux disease) 06/24/2020    Allergies: No Known Allergies Medications:  Current Outpatient Medications:    sertraline (ZOLOFT) 50 MG tablet, Take 50 mg by mouth daily., Disp: , Rfl:    valACYclovir (VALTREX) 1000 MG tablet, TAKE  1 TABLET BY MOUTH TWICE A DAY, Disp: 180 tablet, Rfl: 0  Observations/Objective: Patient is well-developed, well-nourished in no acute distress.  Resting comfortably  at home.  Head is normocephalic, atraumatic.  No labored breathing.  Speech is clear and coherent with logical content.  Patient is alert and oriented at baseline.  FROM neck with pain on rotation in either direction.   Assessment and Plan:  Joy Jackson in today with chief complaint of Back Pain   1. Migraine without aura and without status migrainosus, not intractable Keep diary of migraines Rest Avoid caffeine  2. Cervical pain (neck) Moist heat rest Stretching exercises Need to see PCP for further workup Flexeril with sedation precautions Meds ordered this encounter  Medications   naproxen (NAPROSYN) 500 MG tablet    Sig: Take 1 tablet (500 mg total) by mouth 2 (two) times daily with a meal.    Dispense:  60 tablet    Refill:  1    Order Specific Question:   Supervising Provider    Answer:   Merrilee Jansky [[id number]]   cyclobenzaprine (FLEXERIL) 10 MG tablet    Sig: Take 1 tablet (10 mg total) by mouth 3 (three) times daily as needed for muscle spasms.    Dispense:  30 tablet    Refill:  1    Order Specific Question:   Supervising Provider    Answer:   Merrilee Jansky [id number]      Follow Up Instructions: I discussed the assessment and treatment plan with the patient. The patient was provided an opportunity to ask questions and all were answered. The patient agreed with the plan and demonstrated an understanding of the instructions.  A copy of instructions were sent to the patient via MyChart.  The patient was advised to call back or seek an in-person evaluation if the symptoms worsen or if the condition fails to improve as anticipated.  Time:  I spent 9 minutes with the patient via telehealth technology discussing the above problems/concerns.    Mary-Margaret Daphine Deutscher, FNP

## 2023-06-09 DIAGNOSIS — G43909 Migraine, unspecified, not intractable, without status migrainosus: Secondary | ICD-10-CM | POA: Diagnosis not present

## 2023-06-09 DIAGNOSIS — G43809 Other migraine, not intractable, without status migrainosus: Secondary | ICD-10-CM | POA: Diagnosis not present

## 2023-06-11 ENCOUNTER — Ambulatory Visit: Payer: Self-pay

## 2023-06-11 ENCOUNTER — Ambulatory Visit: Payer: Medicaid Other | Admitting: Physician Assistant

## 2023-06-11 ENCOUNTER — Encounter: Payer: Self-pay | Admitting: Physician Assistant

## 2023-06-11 VITALS — BP 110/74 | HR 66 | Temp 98.6°F | Ht 60.83 in | Wt 168.0 lb

## 2023-06-11 DIAGNOSIS — G43009 Migraine without aura, not intractable, without status migrainosus: Secondary | ICD-10-CM | POA: Diagnosis not present

## 2023-06-11 MED ORDER — SUMATRIPTAN SUCCINATE 50 MG PO TABS: 50.0000 mg | ORAL_TABLET | ORAL | 1 refills | Status: DC | PRN

## 2023-06-11 NOTE — Telephone Encounter (Signed)
     Chief Complaint: Headaches, neck pain, nausea, blurred vision that comes and goes, Dizziness. Symptoms: Above, seen in ED 06/09/23. Frequency: 1-2 weeks Pertinent Negatives: Patient denies blurred vision now Disposition: [] ED /[] Urgent Care (no appt availability in office) / [x] Appointment(In office/virtual)/ []  [facility]/ [] Home Care/ [] Refused Recommended Disposition /[] [facility]/ []  Follow-up with PCP Additional Notes: Instructed to go to ED for worsening of symptoms.  Reason for Disposition  [1] Brief (now gone) blurred vision AND [2] unexplained  Answer Assessment - Initial Assessment Questions 1. DESCRIPTION: "How has your vision changed?" (e.g., complete vision loss, blurred vision, double vision, floaters, etc.)     Blurry 2. LOCATION: "One or both eyes?" If one, ask: "Which eye?"     Both  3. SEVERITY: "Can you see anything?" If Yes, ask: "What can you see?" (e.g., fine print)     Can see fine print 4. ONSET: "When did this begin?" "Did it start suddenly or has this been gradual?"     2 weeks 5. PATTERN: "Does this come and go, or has it been constant since it started?"     Comes and goes 6. PAIN: "Is there any pain in your eye(s)?"  (Scale 1-10; or mild, moderate, severe)   - NONE (0): No pain.   - MILD (1-3): Doesn't interfere with normal activities.   - MODERATE (4-7): Interferes with normal activities or awakens from sleep.    - SEVERE (8-10): Excruciating pain, unable to do any normal activities.     No 7. CONTACTS-GLASSES: "Do you wear contacts or glasses?"     No 8. CAUSE: "What do you think is causing this visual problem?"     Unsure 9. OTHER SYMPTOMS: "Do you have any other symptoms?" (e.g., confusion, headache, arm or leg weakness, speech problems)     Headache, neck pain, dizziness 10. PREGNANCY: "Is there any chance you are pregnant?" "When was your last menstrual period?"       No  Protocols used: Vision Loss or  Change-A-AH

## 2023-06-11 NOTE — Progress Notes (Signed)
Acute Office Visit   Patient: Joy Jackson   DOB: 27-Aug-2001   22 y.o. Female  MRN: [id number] Visit Date: 06/11/2023  Today's healthcare provider: Oswaldo Conroy Lovenia Debruler, PA-C  Introduced myself to the patient as a Secondary school teacher and provided education on APPs in clinical practice.    Chief Complaint  Patient presents with   Headache    Was in ED on 06/09/23 for Migraine, patient also states that she is having neck pain, and ringing in her ears. For the past 3 weeks.   Subjective    HPI HPI     Headache    Additional comments: Was in ED on 06/09/23 for Migraine, patient also states that she is having neck pain, and ringing in her ears. For the past 3 weeks.      Last edited by Sherolyn Buba, CMA on 06/11/2023  3:34 PM.      MIGRAINES Duration: chronic has a hx of migraines but was recently seen by virtual visit and in ED for migraine that has lasted several weeks  Onset: gradual Severity: 3/10 currently  Quality: aching and sore Frequency: constant Location: behind her eyes and tension around her neck and sides of temples  Headache duration: unsure- she reports some mild resolution with intermittent flares  Radiation: yes- she reports some neck pains  Time of day headache occurs:  Alleviating factors:  Aggravating factors:  Headache status at time of visit: current headache Treatments attempted: Treatments attempted: rest and aleve", excedrine   Aura: yes- she reports blurry vision and tinnitus  Nausea:  yes Vomiting: Yes on 06/09/23 prior to ED visit  Photophobia:  yes Phonophobia:  no Effect on social functioning:  yes- impacting her ability to work  Numbers of missed days of school/work each month:  Confusion:  no Gait disturbance/ataxia:  yes Behavioral changes:  no Fevers:  no  She reports she has had tinnitus, dizziness, nausea, and blurry vision with this most recent bout  She was   Medications: Outpatient Medications Prior to Visit  Medication Sig    cyclobenzaprine (FLEXERIL) 10 MG tablet Take 1 tablet (10 mg total) by mouth 3 (three) times daily as needed for muscle spasms.   naproxen (NAPROSYN) 500 MG tablet Take 1 tablet (500 mg total) by mouth 2 (two) times daily with a meal.   sertraline (ZOLOFT) 50 MG tablet Take 50 mg by mouth daily.   valACYclovir (VALTREX) 1000 MG tablet TAKE 1 TABLET BY MOUTH TWICE A DAY   No facility-administered medications prior to visit.    Review of Systems  Constitutional:  Negative for chills and fever.  HENT:  Positive for tinnitus.   Eyes:  Positive for photophobia and visual disturbance.  Gastrointestinal:  Positive for nausea and vomiting.  Musculoskeletal:  Positive for neck pain.  Neurological:  Positive for dizziness, light-headedness and headaches. Negative for numbness.       Objective    BP 110/74   Pulse 66   Temp 98.6 F (37 C) (Oral)   Ht 5' 0.83" (1.545 m)   Wt 168 lb (76.2 kg)   SpO2 99%   BMI 31.92 kg/m    Physical Exam Vitals reviewed.  Constitutional:      Appearance: She is well-developed.  HENT:     Head: Normocephalic and atraumatic.  Eyes:     General: Lids are normal. Gaze aligned appropriately. No scleral icterus.    Extraocular Movements: Extraocular movements intact.  Conjunctiva/sclera: Conjunctivae normal.     Pupils: Pupils are equal, round, and reactive to light. Pupils are equal.  Pulmonary:     Effort: Pulmonary effort is normal.  Musculoskeletal:     Cervical back: Normal range of motion.  Skin:    General: Skin is warm and dry.  Neurological:     General: No focal deficit present.     Mental Status: She is alert and oriented to person, place, and time. Mental status is at baseline.     GCS: GCS eye subscore is 4. GCS verbal subscore is 5. GCS motor subscore is 6.     Cranial Nerves: No dysarthria or facial asymmetry.     Motor: Motor function is intact.     Gait: Gait is intact.  Psychiatric:        Mood and Affect: Mood normal.         Speech: Speech normal.        Behavior: Behavior normal.       No results found for any visits on 06/11/23.  Assessment & Plan      No follow-ups on file.      Problem List Items Addressed This Visit       Cardiovascular and Mediastinum   Migraine without aura and without status migrainosus, not intractable - Primary    Chronic, recurrent  Reports new onset of persistent migraine over the past several weeks with new symptoms of nausea, blurry vision, tinnitus, and dizziness She was seen in the ED on 06/09/23 and was given migraine cocktail which improved but did not resolve her symptoms She has been using Excedrin and reports moderate relief but not complete resolution Migraines along with neck pain may also be consistent with concurrent tension-type headache. We discussed that this is muscular in nature and can be managed with NSAIDs, warm compresses, stretches and massage  Urine pregnancy test was negative  Will try Sumatriptan 50 mg PO Q2h PRN up to 200 mg/ 24 hours  Patient has follow up on 06/15/23- review response to medication at that time- if symptoms are not improving may need imaging for further evaluation        Relevant Medications   SUMAtriptan (IMITREX) 50 MG tablet   Other Relevant Orders   Pregnancy, urine     No follow-ups on file.   I, Lallie Strahm E Titianna Loomis, PA-C, have reviewed all documentation for this visit. The documentation on 06/11/23 for the exam, diagnosis, procedures, and orders are all accurate and complete.   Jacquelin Hawking, MHS, PA-C [facility]

## 2023-06-11 NOTE — Assessment & Plan Note (Signed)
Chronic, recurrent  Reports new onset of persistent migraine over the past several weeks with new symptoms of nausea, blurry vision, tinnitus, and dizziness She was seen in the ED on 06/09/23 and was given migraine cocktail which improved but did not resolve her symptoms She has been using Excedrin and reports moderate relief but not complete resolution Migraines along with neck pain may also be consistent with concurrent tension-type headache. We discussed that this is muscular in nature and can be managed with NSAIDs, warm compresses, stretches and massage  Urine pregnancy test was negative  Will try Sumatriptan 50 mg PO Q2h PRN up to 200 mg/ 24 hours  Patient has follow up on 06/15/23- review response to medication at that time- if symptoms are not improving may need imaging for further evaluation

## 2023-06-12 ENCOUNTER — Encounter: Payer: Self-pay | Admitting: Nurse Practitioner

## 2023-06-12 DIAGNOSIS — R519 Headache, unspecified: Secondary | ICD-10-CM | POA: Diagnosis not present

## 2023-06-12 DIAGNOSIS — R599 Enlarged lymph nodes, unspecified: Secondary | ICD-10-CM | POA: Diagnosis not present

## 2023-06-12 DIAGNOSIS — G43E11 Chronic migraine with aura, intractable, with status migrainosus: Secondary | ICD-10-CM | POA: Diagnosis not present

## 2023-06-12 DIAGNOSIS — M542 Cervicalgia: Secondary | ICD-10-CM | POA: Diagnosis not present

## 2023-06-12 DIAGNOSIS — R590 Localized enlarged lymph nodes: Secondary | ICD-10-CM | POA: Diagnosis not present

## 2023-06-12 DIAGNOSIS — Z79899 Other long term (current) drug therapy: Secondary | ICD-10-CM | POA: Diagnosis not present

## 2023-06-12 DIAGNOSIS — H538 Other visual disturbances: Secondary | ICD-10-CM | POA: Diagnosis not present

## 2023-06-12 DIAGNOSIS — R1110 Vomiting, unspecified: Secondary | ICD-10-CM | POA: Diagnosis not present

## 2023-06-12 DIAGNOSIS — Z1152 Encounter for screening for COVID-19: Secondary | ICD-10-CM | POA: Diagnosis not present

## 2023-06-12 LAB — PREGNANCY, URINE: Preg Test, Ur: NEGATIVE

## 2023-06-13 DIAGNOSIS — R519 Headache, unspecified: Secondary | ICD-10-CM | POA: Diagnosis not present

## 2023-06-15 ENCOUNTER — Encounter: Payer: Self-pay | Admitting: Nurse Practitioner

## 2023-06-15 ENCOUNTER — Ambulatory Visit: Payer: Medicaid Other | Admitting: Nurse Practitioner

## 2023-06-15 ENCOUNTER — Telehealth: Payer: Self-pay

## 2023-06-15 VITALS — BP 115/79 | HR 72 | Temp 98.3°F | Wt 192.8 lb

## 2023-06-15 DIAGNOSIS — G43009 Migraine without aura, not intractable, without status migrainosus: Secondary | ICD-10-CM | POA: Diagnosis not present

## 2023-06-15 DIAGNOSIS — H532 Diplopia: Secondary | ICD-10-CM | POA: Diagnosis not present

## 2023-06-15 DIAGNOSIS — R519 Headache, unspecified: Secondary | ICD-10-CM | POA: Diagnosis not present

## 2023-06-15 DIAGNOSIS — G932 Benign intracranial hypertension: Secondary | ICD-10-CM | POA: Diagnosis not present

## 2023-06-15 DIAGNOSIS — H4710 Unspecified papilledema: Secondary | ICD-10-CM | POA: Diagnosis not present

## 2023-06-15 DIAGNOSIS — F4323 Adjustment disorder with mixed anxiety and depressed mood: Secondary | ICD-10-CM | POA: Diagnosis not present

## 2023-06-15 NOTE — Transitions of Care (Post Inpatient/ED Visit) (Signed)
   06/15/2023  Name: Joy Jackson MRN: [id number] DOB: 10/26/2001  Today's TOC FU Call Status: Today's TOC FU Call Status:: Successful TOC FU Call Competed TOC FU Call Complete Date: 06/15/23  Transition Care Management Follow-up Telephone Call Date of Discharge: 06/13/23 How have you been since you were released from the hospital?: Better Any questions or concerns?: No  Items Reviewed: Did you receive and understand the discharge instructions provided?: Yes Medications obtained,verified, and reconciled?: Yes (Medications Reviewed) Any new allergies since your discharge?: No Dietary orders reviewed?: NA Do you have support at home?: No  Medications Reviewed Today: Medications Reviewed Today     Reviewed by Pablo Ledger, CMA (Certified Medical Assistant) on 06/15/23 at [id number]  Med List Status: <None>   Medication Order Taking? Sig Documenting Provider Last Dose Status Informant  cyclobenzaprine (FLEXERIL) 10 MG tablet [id number] Yes Take 1 tablet (10 mg total) by mouth 3 (three) times daily as needed for muscle spasms. Bennie Pierini, FNP Taking Active   naproxen (NAPROSYN) 500 MG tablet [id number] Yes Take 1 tablet (500 mg total) by mouth 2 (two) times daily with a meal. Daphine Deutscher, Mary-Margaret, FNP Taking Active   prochlorperazine (COMPAZINE) 10 MG tablet [id number] Yes Take 10 mg by mouth every 6 (six) hours as needed. [provider] Taking Active   sertraline (ZOLOFT) 50 MG tablet [id number] Yes Take 50 mg by mouth daily. [provider] Taking Active   SUMAtriptan (IMITREX) 50 MG tablet [id number] Yes Take 1 tablet (50 mg total) by mouth every 2 (two) hours as needed for migraine. May repeat in 2 hours if headache persists or recurs. May repeat every 2 hours as needed. DO not exceed 200 mg in 24 hours Mecum, Erin E, PA-C Taking Active   valACYclovir (VALTREX) 1000 MG tablet [id number] Yes TAKE 1 TABLET BY MOUTH TWICE A Verdon Cummins, NP Taking Active              Home Care and Equipment/Supplies: Were Home Health Services Ordered?: NA Any new equipment or medical supplies ordered?: NA  Functional Questionnaire: Do you need assistance with bathing/showering or dressing?: No Do you need assistance with meal preparation?: No Do you need assistance with eating?: No Do you have difficulty maintaining continence: No Do you need assistance with getting out of bed/getting out of a chair/moving?: No Do you have difficulty managing or taking your medications?: No  Follow up appointments reviewed: PCP Follow-up appointment confirmed?: Yes Date of PCP follow-up appointment?: 06/15/23 Follow-up Provider: Larae Grooms, NP Specialist Hospital Follow-up appointment confirmed?: Yes Date of Specialist follow-up appointment?: 06/15/23 Follow-Up Specialty Provider:: [facility] Do you need transportation to your follow-up appointment?: No Do you understand care options if your condition(s) worsen?: Yes-patient verbalized understanding    SIGNATURE: Wilhemena Durie, CMA

## 2023-06-15 NOTE — Assessment & Plan Note (Signed)
Chronic. Ongoing concern.  Seems to be more related to eye sight. Has not used the Sumatriptan due to the pain being more behind her eye.  Labs ordered at visit today.  Follow up in 1 week for reevaluation.

## 2023-06-15 NOTE — Progress Notes (Signed)
BP 115/79   Pulse 72   Temp 98.3 F (36.8 C) (Oral)   Wt 192 lb 12.8 oz (87.5 kg)   SpO2 98%   BMI 36.64 kg/m    Subjective:    Patient ID: Joy Jackson, female    DOB: May 27, 2001, 22 y.o.   MRN: [id number]  HPI: Joy Jackson is a 22 y.o. female  Chief Complaint  Patient presents with   Neck Pain   Migraine   MIGRAINES Patient states that her migraines and headaches for about a month.  She is having neck pain that is improved some.  States her vision has been blurry- she does have an appt with an ophthalmologist today.    She has been seen in the ER twice recently.  Does not know what is causing her migraines.    She states she hasn't been having migraine pain as much but more eye pain behind her eye.  Denies light and sound sensitivity.   Duration: chronic has a hx of migraines but was recently seen by virtual visit and in ED for migraine that has lasted several weeks  Onset: gradual Severity: 3/10 currently  Quality: aching and sore Frequency: constant Location: behind her eyes and tension around her neck and sides of temples  Headache duration: unsure- she reports some mild resolution with intermittent flares  Radiation: yes- she reports some neck pains  Time of day headache occurs:  Alleviating factors:  Aggravating factors:  Headache status at time of visit: current headache Treatments attempted: Treatments attempted: rest and aleve", excedrine   Aura: yes- she reports blurry vision and tinnitus  Nausea:  yes Vomiting: Yes on 06/09/23 prior to ED visit  Photophobia:  yes Phonophobia:  no Effect on social functioning:  yes- impacting her ability to work  Numbers of missed days of school/work each month:  Confusion:  no Gait disturbance/ataxia:  yes Behavioral changes:  no Fevers:  no Relevant past medical, surgical, family and social history reviewed and updated as indicated. Interim medical history since our last visit reviewed. Allergies and medications  reviewed and updated.  Review of Systems  Eyes:  Positive for photophobia and visual disturbance.  Neurological:  Positive for headaches.    Per HPI unless specifically indicated above     Objective:    BP 115/79   Pulse 72   Temp 98.3 F (36.8 C) (Oral)   Wt 192 lb 12.8 oz (87.5 kg)   SpO2 98%   BMI 36.64 kg/m   Wt Readings from Last 3 Encounters:  06/15/23 192 lb 12.8 oz (87.5 kg)  06/11/23 168 lb (76.2 kg)  07/24/22 168 lb (76.2 kg) (91 %, Z= 1.36)*   * Growth percentiles are based on CDC (Girls, 2-20 Years) data.    Physical Exam Vitals and nursing note reviewed.  Constitutional:      General: She is not in acute distress.    Appearance: Normal appearance. She is normal weight. She is not ill-appearing, toxic-appearing or diaphoretic.  HENT:     Head: Normocephalic.     Right Ear: External ear normal.     Left Ear: External ear normal.     Nose: Nose normal.     Mouth/Throat:     Mouth: Mucous membranes are moist.     Pharynx: Oropharynx is clear.  Eyes:     General:        Right eye: No discharge.        Left eye: No discharge.  Extraocular Movements: Extraocular movements intact.     Conjunctiva/sclera: Conjunctivae normal.     Pupils: Pupils are equal, round, and reactive to light.  Cardiovascular:     Rate and Rhythm: Normal rate and regular rhythm.     Heart sounds: No murmur heard. Pulmonary:     Effort: Pulmonary effort is normal. No respiratory distress.     Breath sounds: Normal breath sounds. No wheezing or rales.  Musculoskeletal:     Cervical back: Normal range of motion and neck supple.  Skin:    General: Skin is warm and dry.     Capillary Refill: Capillary refill takes less than 2 seconds.  Neurological:     General: No focal deficit present.     Mental Status: She is alert and oriented to person, place, and time. Mental status is at baseline.     Cranial Nerves: Cranial nerves 2-12 are intact.     Sensory: Sensation is intact.      Motor: Weakness present.     Coordination: Coordination is intact.     Gait: Gait is intact.  Psychiatric:        Mood and Affect: Mood normal.        Behavior: Behavior normal.        Thought Content: Thought content normal.        Judgment: Judgment normal.     Results for orders placed or performed in visit on 06/11/23  Pregnancy, urine  Result Value Ref Range   Preg Test, Ur Negative Negative      Assessment & Plan:   Problem List Items Addressed This Visit       Cardiovascular and Mediastinum   Migraine without aura and without status migrainosus, not intractable - Primary    Chronic. Ongoing concern.  Seems to be more related to eye sight. Has not used the Sumatriptan due to the pain being more behind her eye.  Labs ordered at visit today.  Follow up in 1 week for reevaluation.       Relevant Orders   Comp Met (CMET)   CBC w/Diff   B12   Vitamin D (25 hydroxy)     Follow up plan: Return in about 1 week (around 06/22/2023) for Migraines.

## 2023-06-16 DIAGNOSIS — F4323 Adjustment disorder with mixed anxiety and depressed mood: Secondary | ICD-10-CM | POA: Diagnosis not present

## 2023-06-16 DIAGNOSIS — G932 Benign intracranial hypertension: Secondary | ICD-10-CM | POA: Diagnosis not present

## 2023-06-16 LAB — CBC WITH DIFFERENTIAL/PLATELET
Basophils Absolute: 0 10*3/uL (ref 0.0–0.2)
Basos: 1 %
EOS (ABSOLUTE): 0.1 10*3/uL (ref 0.0–0.4)
Eos: 2 %
Hematocrit: 41.2 % (ref 34.0–46.6)
Hemoglobin: 13.8 g/dL (ref 11.1–15.9)
Immature Grans (Abs): 0 10*3/uL (ref 0.0–0.1)
Immature Granulocytes: 0 %
Lymphocytes Absolute: 2.5 10*3/uL (ref 0.7–3.1)
Lymphs: 33 %
MCH: 29.8 pg (ref 26.6–33.0)
MCHC: 33.5 g/dL (ref 31.5–35.7)
MCV: 89 fL (ref 79–97)
Monocytes Absolute: 0.5 10*3/uL (ref 0.1–0.9)
Monocytes: 6 %
Neutrophils Absolute: 4.5 10*3/uL (ref 1.4–7.0)
Neutrophils: 58 %
Platelets: 323 10*3/uL (ref 150–450)
RBC: 4.63 x10E6/uL (ref 3.77–5.28)
RDW: 13.1 % (ref 11.7–15.4)
WBC: 7.6 10*3/uL (ref 3.4–10.8)

## 2023-06-16 LAB — VITAMIN B12: Vitamin B-12: 409 pg/mL (ref 232–1245)

## 2023-06-16 LAB — COMPREHENSIVE METABOLIC PANEL
ALT: 9 IU/L (ref 0–32)
AST: 18 IU/L (ref 0–40)
Albumin: 4.5 g/dL (ref 4.0–5.0)
Alkaline Phosphatase: 79 IU/L (ref 42–106)
BUN/Creatinine Ratio: 14 (ref 9–23)
BUN: 15 mg/dL (ref 6–20)
Bilirubin Total: 0.2 mg/dL (ref 0.0–1.2)
CO2: 24 mmol/L (ref 20–29)
Calcium: 9.9 mg/dL (ref 8.7–10.2)
Chloride: 102 mmol/L (ref 96–106)
Creatinine, Ser: 1.09 mg/dL — ABNORMAL HIGH (ref 0.57–1.00)
Globulin, Total: 2.2 g/dL (ref 1.5–4.5)
Glucose: 91 mg/dL (ref 70–99)
Potassium: 4.5 mmol/L (ref 3.5–5.2)
Sodium: 139 mmol/L (ref 134–144)
Total Protein: 6.7 g/dL (ref 6.0–8.5)
eGFR: 75 mL/min/{1.73_m2} (ref >59)

## 2023-06-16 LAB — VITAMIN D 25 HYDROXY (VIT D DEFICIENCY, FRACTURES): Vit D, 25-Hydroxy: 26.3 ng/mL — ABNORMAL LOW (ref 30.0–100.0)

## 2023-06-16 NOTE — Progress Notes (Signed)
Hi Joy Jackson. It was nice to see you yesterday.  Your lab work looks good.  Make sure you are drinking plenty of water.  Your Vitamin D is a little low.  I recommend Vitamin D 1000 international international to help with this.  No other concerns at this time. Continue with your current medication regimen.  Follow up as discussed.  Please let me know if you have any questions.

## 2023-06-19 ENCOUNTER — Ambulatory Visit: Payer: Medicaid Other | Admitting: Physician Assistant

## 2023-07-20 ENCOUNTER — Telehealth: Payer: Self-pay | Admitting: Nurse Practitioner

## 2023-07-20 NOTE — Telephone Encounter (Signed)
Mr. Joy Jackson with [facility], calling to request follow up with PCP due to 3 ED visits in June with migraine. Called pt. And she declines OV at this time. States "I'm seeing a specialist for this right now." Instructed to call back for worsening of symptoms.

## 2023-08-11 DIAGNOSIS — F3341 Major depressive disorder, recurrent, in partial remission: Secondary | ICD-10-CM | POA: Diagnosis not present

## 2023-08-11 DIAGNOSIS — F411 Generalized anxiety disorder: Secondary | ICD-10-CM | POA: Diagnosis not present

## 2023-10-31 DIAGNOSIS — J029 Acute pharyngitis, unspecified: Secondary | ICD-10-CM | POA: Diagnosis not present

## 2023-10-31 DIAGNOSIS — R519 Headache, unspecified: Secondary | ICD-10-CM | POA: Diagnosis not present

## 2023-10-31 DIAGNOSIS — Z20822 Contact with and (suspected) exposure to covid-19: Secondary | ICD-10-CM | POA: Diagnosis not present

## 2023-10-31 DIAGNOSIS — J069 Acute upper respiratory infection, unspecified: Secondary | ICD-10-CM | POA: Diagnosis not present

## 2023-10-31 DIAGNOSIS — R0689 Other abnormalities of breathing: Secondary | ICD-10-CM | POA: Diagnosis not present

## 2023-10-31 DIAGNOSIS — R110 Nausea: Secondary | ICD-10-CM | POA: Diagnosis not present

## 2023-10-31 DIAGNOSIS — R059 Cough, unspecified: Secondary | ICD-10-CM | POA: Diagnosis not present

## 2023-11-09 DIAGNOSIS — J069 Acute upper respiratory infection, unspecified: Secondary | ICD-10-CM | POA: Diagnosis not present

## 2023-11-19 DIAGNOSIS — Z1152 Encounter for screening for COVID-19: Secondary | ICD-10-CM | POA: Diagnosis not present

## 2023-11-19 DIAGNOSIS — J029 Acute pharyngitis, unspecified: Secondary | ICD-10-CM | POA: Diagnosis not present

## 2023-12-03 DIAGNOSIS — R519 Headache, unspecified: Secondary | ICD-10-CM | POA: Diagnosis not present

## 2023-12-03 DIAGNOSIS — R200 Anesthesia of skin: Secondary | ICD-10-CM | POA: Diagnosis not present

## 2023-12-03 DIAGNOSIS — R112 Nausea with vomiting, unspecified: Secondary | ICD-10-CM | POA: Diagnosis not present

## 2023-12-03 DIAGNOSIS — F17290 Nicotine dependence, other tobacco product, uncomplicated: Secondary | ICD-10-CM | POA: Diagnosis not present

## 2023-12-03 DIAGNOSIS — R202 Paresthesia of skin: Secondary | ICD-10-CM | POA: Diagnosis not present

## 2023-12-03 DIAGNOSIS — R42 Dizziness and giddiness: Secondary | ICD-10-CM | POA: Diagnosis not present

## 2023-12-03 DIAGNOSIS — H538 Other visual disturbances: Secondary | ICD-10-CM | POA: Diagnosis not present

## 2024-01-04 ENCOUNTER — Ambulatory Visit: Payer: Medicaid Other | Admitting: Nurse Practitioner

## 2024-01-04 ENCOUNTER — Telehealth: Payer: Medicaid Other | Admitting: Nurse Practitioner

## 2024-01-04 ENCOUNTER — Encounter: Payer: Self-pay | Admitting: Nurse Practitioner

## 2024-01-05 ENCOUNTER — Encounter: Payer: Self-pay | Admitting: Nurse Practitioner

## 2024-01-05 ENCOUNTER — Telehealth (INDEPENDENT_AMBULATORY_CARE_PROVIDER_SITE_OTHER): Payer: Medicaid Other | Admitting: Nurse Practitioner

## 2024-01-05 VITALS — Ht 60.0 in

## 2024-01-05 DIAGNOSIS — G932 Benign intracranial hypertension: Secondary | ICD-10-CM

## 2024-01-05 MED ORDER — ACETAZOLAMIDE ER 500 MG PO CP12: 1000.0000 mg | ORAL_CAPSULE | Freq: Two times a day (BID) | ORAL | 0 refills | Status: AC

## 2024-01-05 NOTE — Assessment & Plan Note (Signed)
 Chronic. Not well controlled.  Has not followed with [facility] since diagnosis.  States [facility] no longer takes her Medicaid.  Patient is primarily in [place] and needs to establish care in that area.  Recommend establishing with a Primary Care and Ophthalmologist in [place] to help with continuity of care.  Discussed resources given to her by ER in December.  Will refill medication for 30 days until patient is able to establish care.

## 2024-01-05 NOTE — Progress Notes (Signed)
 Ht 5' (1.524 m)   BMI 37.65 kg/m    Subjective:    Patient ID: Joy Jackson, female    DOB: September 20, 2001, 23 y.o.   MRN: [id number]  HPI: Joy Jackson is a 23 y.o. 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  She was placed on Acetazolamide  1000mg  BID.  She has been out of the medication for about 4 months.  She has noticed that her headaches are starting to come back.  States [facility] no longer takes her medicaid.  She was seen in the ER    Relevant past medical, surgical, family and social history reviewed and updated as indicated. Interim medical history since our last visit reviewed. Allergies and medications reviewed and updated.  Review of Systems  Eyes:  Positive for visual disturbance.  Neurological:  Positive for headaches.    Per HPI unless specifically indicated above     Objective:    Ht 5' (1.524 m)   BMI 37.65 kg/m   Wt Readings from Last 3 Encounters:  06/15/23 192 lb 12.8 oz (87.5 kg)  06/11/23 168 lb (76.2 kg)  07/24/22 168 lb (76.2 kg) (91%, Z= 1.36)*   * Growth percentiles are based on CDC (Girls, 2-20 Years) data.    Physical Exam Vitals and nursing note reviewed.  HENT:     Head: Normocephalic.     Right Ear: Hearing normal.     Left Ear: Hearing normal.     Nose: Nose normal.  Eyes:     Pupils: Pupils are equal, round, and reactive to light.  Pulmonary:     Effort: Pulmonary effort is normal. No respiratory distress.  Neurological:     Mental Status: She is alert.  Psychiatric:        Mood and Affect: Mood normal.        Behavior: Behavior normal.        Thought Content: Thought content normal.        Judgment: Judgment normal.     Results for orders placed or performed in visit on 06/15/23  Comp Met (CMET)   Collection Time: 06/15/23 11:21 AM  Result Value Ref Range   Glucose 91 70 - 99 mg/dL   BUN  15 6 - 20 mg/dL   Creatinine, Ser 8.90 (H) 0.57 - 1.00 mg/dL   eGFR 75 >40 fO/fpw/8.26   BUN/Creatinine Ratio 14 9 - 23   Sodium 139 134 - 144 mmol/L   Potassium 4.5 3.5 - 5.2 mmol/L   Chloride 102 96 - 106 mmol/L   CO2 24 20 - 29 mmol/L   Calcium 9.9 8.7 - 10.2 mg/dL   Total Protein 6.7 6.0 - 8.5 g/dL   Albumin 4.5 4.0 - 5.0 g/dL   Globulin, Total 2.2 1.5 - 4.5 g/dL   Bilirubin Total 0.2 0.0 - 1.2 mg/dL   Alkaline Phosphatase 79 42 - 106 IU/L   AST 18 0 - 40 IU/L   ALT 9 0 - 32 IU/L  CBC w/Diff   Collection Time: 06/15/23 11:21 AM  Result Value Ref Range   WBC 7.6 3.4 - 10.8 x10E3/uL   RBC 4.63 3.77 - 5.28 x10E6/uL   Hemoglobin 13.8 11.1 - 15.9 g/dL   Hematocrit 58.7 65.9 - 46.6 %   MCV 89 79 - 97 fL   MCH 29.8 26.6 - 33.0 pg   MCHC  33.5 31.5 - 35.7 g/dL   RDW 86.8 88.2 - 84.5 %   Platelets 323 150 - 450 x10E3/uL   Neutrophils 58 Not Estab. %   Lymphs 33 Not Estab. %   Monocytes 6 Not Estab. %   Eos 2 Not Estab. %   Basos 1 Not Estab. %   Neutrophils Absolute 4.5 1.4 - 7.0 x10E3/uL   Lymphocytes Absolute 2.5 0.7 - 3.1 x10E3/uL   Monocytes Absolute 0.5 0.1 - 0.9 x10E3/uL   EOS (ABSOLUTE) 0.1 0.0 - 0.4 x10E3/uL   Basophils Absolute 0.0 0.0 - 0.2 x10E3/uL   Immature Granulocytes 0 Not Estab. %   Immature Grans (Abs) 0.0 0.0 - 0.1 x10E3/uL  B12   Collection Time: 06/15/23 11:21 AM  Result Value Ref Range   Vitamin B-12 409 232 - 1,245 pg/mL  Vitamin D  (25 hydroxy)   Collection Time: 06/15/23 11:21 AM  Result Value Ref Range   Vit D, 25-Hydroxy 26.3 (L) 30.0 - 100.0 ng/mL      Assessment & Plan:   Problem List Items Addressed This Visit       Nervous and Auditory   IIH (idiopathic intracranial hypertension) - Primary   Chronic. Not well controlled.  Has not followed with [facility] since diagnosis.  States [facility] no longer takes her Medicaid.  Patient is primarily in [place] and needs to establish care in that area.  Recommend establishing with a Primary  Care and Ophthalmologist in [place] to help with continuity of care.  Discussed resources given to her by ER in December.  Will refill medication for 30 days until patient is able to establish care.        Follow up plan: No follow-ups on file.   This visit was completed via MyChart due to the restrictions of the COVID-19 pandemic. All issues as above were discussed and addressed. Physical exam was done as above through visual confirmation on MyChart. If it was felt that the patient should be evaluated in the office, they were directed there. The patient verbally consented to this visit. Location of the patient: Home Location of the provider: Office Those involved with this call:  Provider: Darice Petty, NP CMA: Rolande Edison, CMA Front Desk/Registration: Claretta Maiden This encounter was conducted via video.  I spent 20 dedicated to the care of this patient on the date of this encounter to include previsit review of symptoms, plan of care and follow up, face to face time with the patient, and post visit ordering of testing.

## 2024-01-13 ENCOUNTER — Telehealth: Payer: Medicaid Other | Admitting: Nurse Practitioner

## 2024-01-15 DIAGNOSIS — R002 Palpitations: Secondary | ICD-10-CM | POA: Diagnosis not present

## 2024-01-15 DIAGNOSIS — R079 Chest pain, unspecified: Secondary | ICD-10-CM | POA: Diagnosis not present

## 2024-01-15 DIAGNOSIS — Z3202 Encounter for pregnancy test, result negative: Secondary | ICD-10-CM | POA: Diagnosis not present

## 2024-01-15 DIAGNOSIS — R350 Frequency of micturition: Secondary | ICD-10-CM | POA: Diagnosis not present

## 2024-01-15 DIAGNOSIS — R0600 Dyspnea, unspecified: Secondary | ICD-10-CM | POA: Diagnosis not present

## 2024-02-04 DIAGNOSIS — Z905 Acquired absence of kidney: Secondary | ICD-10-CM | POA: Diagnosis not present

## 2024-02-04 DIAGNOSIS — R944 Abnormal results of kidney function studies: Secondary | ICD-10-CM | POA: Diagnosis not present

## 2024-02-04 DIAGNOSIS — R0600 Dyspnea, unspecified: Secondary | ICD-10-CM | POA: Diagnosis not present

## 2024-02-05 DIAGNOSIS — R0600 Dyspnea, unspecified: Secondary | ICD-10-CM | POA: Diagnosis not present

## 2024-02-05 DIAGNOSIS — Z905 Acquired absence of kidney: Secondary | ICD-10-CM | POA: Diagnosis not present

## 2024-02-05 DIAGNOSIS — R944 Abnormal results of kidney function studies: Secondary | ICD-10-CM | POA: Diagnosis not present

## 2024-02-23 DIAGNOSIS — Z130 Encounter for screening for diseases of the blood and blood-forming organs and certain disorders involving the immune mechanism: Secondary | ICD-10-CM | POA: Diagnosis not present

## 2024-02-23 DIAGNOSIS — Z114 Encounter for screening for human immunodeficiency virus [HIV]: Secondary | ICD-10-CM | POA: Diagnosis not present

## 2024-02-23 DIAGNOSIS — E559 Vitamin D deficiency, unspecified: Secondary | ICD-10-CM | POA: Diagnosis not present

## 2024-02-23 DIAGNOSIS — Z131 Encounter for screening for diabetes mellitus: Secondary | ICD-10-CM | POA: Diagnosis not present

## 2024-02-23 DIAGNOSIS — Z1159 Encounter for screening for other viral diseases: Secondary | ICD-10-CM | POA: Diagnosis not present

## 2024-02-23 DIAGNOSIS — G932 Benign intracranial hypertension: Secondary | ICD-10-CM | POA: Diagnosis not present

## 2024-03-04 ENCOUNTER — Other Ambulatory Visit: Payer: Self-pay | Admitting: Nurse Practitioner

## 2024-03-07 NOTE — Telephone Encounter (Signed)
 Pt had VV but issue was not discussed - called pt to make appt but had to LM on VM. Pt was moving and last refills were for 30 days each. Last RF 03/27/23 #180 Active med list yes FVS: no LM on VM to call back.   Requested Prescriptions  Pending Prescriptions Disp Refills   valACYclovir (VALTREX) 1000 MG tablet [Pharmacy Med Name: VALACYCLOVIR HCL 1 GRAM TABLET] 180 tablet 1    Sig: TAKE 1 TABLET BY MOUTH TWICE A DAY     Antimicrobials:  Antiviral Agents - Anti-Herpetic Passed - 03/07/2024  9:45 AM      Passed - Valid encounter within last 12 months    Recent Outpatient Visits           2 months ago IIH (idiopathic intracranial hypertension)   [place] [facility] [place], Clydie Braun, NP   8 months ago Migraine without aura and without status migrainosus, not intractable   [place] [facility] Larae Grooms, NP   9 months ago Migraine without aura and without status migrainosus, not intractable   [place] [facility] Mecum, Oswaldo Conroy, PA-C   1 year ago Erroneous encounter - disregard   [facility] [facility] Larae Grooms, NP   1 year ago Failure to attend appointment   [facility] [facility] Mecum, Oswaldo Conroy, PA-C

## 2024-05-26 DIAGNOSIS — Z905 Acquired absence of kidney: Secondary | ICD-10-CM | POA: Diagnosis not present

## 2024-06-02 DIAGNOSIS — J4521 Mild intermittent asthma with (acute) exacerbation: Secondary | ICD-10-CM | POA: Diagnosis not present

## 2024-06-02 DIAGNOSIS — J302 Other seasonal allergic rhinitis: Secondary | ICD-10-CM | POA: Diagnosis not present

## 2024-06-20 DIAGNOSIS — H6991 Unspecified Eustachian tube disorder, right ear: Secondary | ICD-10-CM | POA: Diagnosis not present

## 2024-06-20 DIAGNOSIS — H9311 Tinnitus, right ear: Secondary | ICD-10-CM | POA: Diagnosis not present

## 2024-07-28 ENCOUNTER — Telehealth

## 2024-07-28 ENCOUNTER — Telehealth: Admitting: Physician Assistant

## 2024-07-28 NOTE — Progress Notes (Signed)
 Patient meaning to schedule with PCP for weight loss medications. Accidentally scheduled with our virtual urgent care team. Instructions on how to get properly scheduled given. Encounter closed. No charge.

## 2024-07-29 NOTE — Progress Notes (Signed)
 Appt scheduled

## 2024-08-03 ENCOUNTER — Encounter: Payer: Self-pay | Admitting: Nurse Practitioner

## 2024-08-03 ENCOUNTER — Telehealth: Admitting: Nurse Practitioner

## 2024-08-03 VITALS — Ht 60.0 in | Wt 199.0 lb

## 2024-08-03 DIAGNOSIS — E669 Obesity, unspecified: Secondary | ICD-10-CM

## 2024-08-03 MED ORDER — WEGOVY 0.5 MG/0.5ML ~~LOC~~ SOAJ: 0.5000 mg | SUBCUTANEOUS | 2 refills | Status: DC

## 2024-08-03 MED ORDER — WEGOVY 0.25 MG/0.5ML ~~LOC~~ SOAJ: 0.2500 mg | SUBCUTANEOUS | 0 refills | Status: DC

## 2024-08-03 NOTE — Progress Notes (Signed)
 Called patient and left a message for her to call back to get scheduled. Return in about 2 months (around 10/03/2024) for Weight Managment (virtual).

## 2024-08-03 NOTE — Assessment & Plan Note (Signed)
 Chronic.  Not well controlled.  Patient has tried diet and exercise for weight loss without success.  Will start [facility] 0.25mg  weekly.  Will increase to [facility] 0.5mg  weekly after the first 4 weeks.  Discussed how to inject medication.  Discussed side effects and benefits of medication.  Follow up in 2 months.  Call sooner if concerns arise.

## 2024-08-03 NOTE — Progress Notes (Signed)
 Ht 5' (1.524 m)   Wt 199 lb (90.3 kg)   BMI 38.86 kg/m    Subjective:    Patient ID: Joy Jackson, female    DOB: 29-Jun-2001, 23 y.o.   MRN: [id number]  HPI: Joy Jackson is a 23 y.o. female  Chief Complaint  Patient presents with   Obesity    Pt would like to discuss weight loss medication. Interested In wegovy    WEIGHT GAIN Duration: over 1 year Previous attempts at weight loss: diet and exercise Complications of obesity: IIH Peak weight: 199lb Weight loss goal: 150lb Weight loss to date: 1 year  Requesting obesity pharmacotherapy: yes Current weight loss supplements/medications: no Previous weight loss supplements/meds: yes    Relevant past medical, surgical, family and social history reviewed and updated as indicated. Interim medical history since our last visit reviewed. Allergies and medications reviewed and updated.  Review of Systems  Constitutional:  Positive for unexpected weight change.    Per HPI unless specifically indicated above     Objective:    Ht 5' (1.524 m)   Wt 199 lb (90.3 kg)   BMI 38.86 kg/m   Wt Readings from Last 3 Encounters:  08/03/24 199 lb (90.3 kg)  06/15/23 192 lb 12.8 oz (87.5 kg)  06/11/23 168 lb (76.2 kg)    Physical Exam Vitals and nursing note reviewed.  HENT:     Head: Normocephalic.     Right Ear: Hearing normal.     Left Ear: Hearing normal.     Nose: Nose normal.  Eyes:     Pupils: Pupils are equal, round, and reactive to light.  Pulmonary:     Effort: Pulmonary effort is normal. No respiratory distress.  Neurological:     Mental Status: She is alert.  Psychiatric:        Mood and Affect: Mood normal.        Behavior: Behavior normal.        Thought Content: Thought content normal.        Judgment: Judgment normal.     Results for orders placed or performed in visit on 06/15/23  Comp Met (CMET)   Collection Time: 06/15/23 11:21 AM  Result Value Ref Range   Glucose 91 70 - 99 mg/dL   BUN 15 6 -  20 mg/dL   Creatinine, Ser 8.90 (H) 0.57 - 1.00 mg/dL   eGFR 75 >40 fO/fpw/8.26   BUN/Creatinine Ratio 14 9 - 23   Sodium 139 134 - 144 mmol/L   Potassium 4.5 3.5 - 5.2 mmol/L   Chloride 102 96 - 106 mmol/L   CO2 24 20 - 29 mmol/L   Calcium 9.9 8.7 - 10.2 mg/dL   Total Protein 6.7 6.0 - 8.5 g/dL   Albumin 4.5 4.0 - 5.0 g/dL   Globulin, Total 2.2 1.5 - 4.5 g/dL   Bilirubin Total 0.2 0.0 - 1.2 mg/dL   Alkaline Phosphatase 79 42 - 106 IU/L   AST 18 0 - 40 IU/L   ALT 9 0 - 32 IU/L  CBC w/Diff   Collection Time: 06/15/23 11:21 AM  Result Value Ref Range   WBC 7.6 3.4 - 10.8 x10E3/uL   RBC 4.63 3.77 - 5.28 x10E6/uL   Hemoglobin 13.8 11.1 - 15.9 g/dL   Hematocrit 58.7 65.9 - 46.6 %   MCV 89 79 - 97 fL   MCH 29.8 26.6 - 33.0 pg   MCHC 33.5 31.5 - 35.7 g/dL   RDW 86.8 88.2 -  15.4 %   Platelets 323 150 - 450 x10E3/uL   Neutrophils 58 Not Estab. %   Lymphs 33 Not Estab. %   Monocytes 6 Not Estab. %   Eos 2 Not Estab. %   Basos 1 Not Estab. %   Neutrophils Absolute 4.5 1.4 - 7.0 x10E3/uL   Lymphocytes Absolute 2.5 0.7 - 3.1 x10E3/uL   Monocytes Absolute 0.5 0.1 - 0.9 x10E3/uL   EOS (ABSOLUTE) 0.1 0.0 - 0.4 x10E3/uL   Basophils Absolute 0.0 0.0 - 0.2 x10E3/uL   Immature Granulocytes 0 Not Estab. %   Immature Grans (Abs) 0.0 0.0 - 0.1 x10E3/uL  B12   Collection Time: 06/15/23 11:21 AM  Result Value Ref Range   Vitamin B-12 409 232 - 1,245 pg/mL  Vitamin D  (25 hydroxy)   Collection Time: 06/15/23 11:21 AM  Result Value Ref Range   Vit D, 25-Hydroxy 26.3 (L) 30.0 - 100.0 ng/mL      Assessment & Plan:   Problem List Items Addressed This Visit       Other   Obesity (BMI 30-39.9) - Primary   Chronic.  Not well controlled.  Patient has tried diet and exercise for weight loss without success.  Will start Wegovy  0.25mg  weekly.  Will increase to Wegovy  0.5mg  weekly after the first 4 weeks.  Discussed how to inject medication.  Discussed side effects and benefits of medication.   Follow up in 2 months.  Call sooner if concerns arise.        Relevant Medications   semaglutide -weight management (WEGOVY ) 0.25 MG/0.5ML SOAJ SQ injection   semaglutide -weight management (WEGOVY ) 0.5 MG/0.5ML SOAJ SQ injection     Follow up plan: Return in about 2 months (around 10/03/2024) for Weight Managment (virtual).   This visit was completed via MyChart due to the restrictions of the COVID-19 pandemic. All issues as above were discussed and addressed. Physical exam was done as above through visual confirmation on MyChart. If it was felt that the patient should be evaluated in the office, they were directed there. The patient verbally consented to this visit. Location of the patient: Home Location of the provider: Office Those involved with this call:  Provider: Darice Petty, NP CMA: Gareth Fogo, CMA Front Desk/Registration: Claretta Maiden This encounter was conducted via video.  I spent 30 minutes dedicated to the care of this patient on the date of this encounter to include previsit review of symptoms, plan of care and medications, face to face time with the patient, and post visit ordering of testing.

## 2024-08-04 NOTE — Progress Notes (Signed)
 Scheduled

## 2024-08-05 ENCOUNTER — Telehealth: Payer: Self-pay

## 2024-08-05 NOTE — Telephone Encounter (Signed)
 Pharmacy Patient Advocate Encounter  Received notification from [facility] that Prior Authorization for Wegovy  0.25MG /0.5ML auto-injectors has been APPROVED from 08/05/2024 to 02/01/2025   PA #/Case ID/Reference #: [id number]

## 2024-08-05 NOTE — Telephone Encounter (Signed)
 Pharmacy Patient Advocate Encounter   Received notification from CoverMyMeds that prior authorization for Wegovy  0.25MG /0.5ML auto-injectors is required/requested.   Insurance verification completed.   The patient is insured through [facility] .   Per test claim: PA required; PA submitted to above mentioned insurance via CoverMyMeds Key/confirmation #/EOC [id number] Status is pending

## 2024-08-24 ENCOUNTER — Ambulatory Visit: Admitting: Nurse Practitioner

## 2024-08-31 ENCOUNTER — Other Ambulatory Visit: Payer: Self-pay | Admitting: Nurse Practitioner

## 2024-09-01 NOTE — Telephone Encounter (Signed)
 Requested Prescriptions  Refused Prescriptions Disp Refills   WEGOVY  0.25 MG/0.5ML SOAJ SQ injection [Pharmacy Med Name: WEGOVY  0.25 MG/0.5 ML PEN]      Sig: INJECT 0.25MG  INTO THE SKIN ONE TIME PER WEEK     Endocrinology:  Diabetes - GLP-1 Receptor Agonists - semaglutide  Failed - 09/01/2024 10:36 AM      Failed - HBA1C in normal range and within 180 days    No results found for: HGBA1C, LABA1C       Failed - Cr in normal range and within 360 days    Creatinine, Ser  Date Value Ref Range Status  06/15/2023 1.09 (H) 0.57 - 1.00 mg/dL Final         Passed - Valid encounter within last 6 months    Recent Outpatient Visits           4 weeks ago Obesity (BMI 30-39.9)   [facility] Melvin Pao, NP

## 2024-09-22 ENCOUNTER — Encounter: Payer: Self-pay | Admitting: Nurse Practitioner

## 2024-09-26 ENCOUNTER — Encounter: Payer: Self-pay | Admitting: Nurse Practitioner

## 2024-09-26 ENCOUNTER — Telehealth: Admitting: Nurse Practitioner

## 2024-09-26 DIAGNOSIS — E669 Obesity, unspecified: Secondary | ICD-10-CM | POA: Diagnosis not present

## 2024-09-26 MED ORDER — WEGOVY 1 MG/0.5ML ~~LOC~~ SOAJ: 1.0000 mg | SUBCUTANEOUS | 0 refills | Status: DC

## 2024-09-26 NOTE — Progress Notes (Signed)
 LMP 09/10/2024 (Approximate)    Subjective:    Patient ID: Joy Jackson, female    DOB: 08-Dec-2001, 23 y.o.   MRN: [id number]  HPI: Joy Jackson is a 23 y.o. female  Chief Complaint  Patient presents with   medication mangement   WEIGHT GAIN She hasn't weighed herself recently.  She doesn't feel like she isn't losing as much but also hasn't been hitting her protein goal either.  Duration: over 1 year Previous attempts at weight loss: diet and exercise Complications of obesity: IIH Peak weight: 199lb Weight loss goal: 150lb Weight loss to date: 1 year  Requesting obesity pharmacotherapy: yes Current weight loss supplements/medications: no Previous weight loss supplements/meds: yes    Relevant past medical, surgical, family and social history reviewed and updated as indicated. Interim medical history since our last visit reviewed. Allergies and medications reviewed and updated.  Review of Systems  Constitutional:  Positive for unexpected weight change.    Per HPI unless specifically indicated above     Objective:    LMP 09/10/2024 (Approximate)   Wt Readings from Last 3 Encounters:  08/03/24 199 lb (90.3 kg)  06/15/23 192 lb 12.8 oz (87.5 kg)  06/11/23 168 lb (76.2 kg)    Physical Exam Vitals and nursing note reviewed.  HENT:     Head: Normocephalic.     Right Ear: Hearing normal.     Left Ear: Hearing normal.     Nose: Nose normal.  Eyes:     Pupils: Pupils are equal, round, and reactive to light.  Pulmonary:     Effort: Pulmonary effort is normal. No respiratory distress.  Neurological:     Mental Status: She is alert.  Psychiatric:        Mood and Affect: Mood normal.        Behavior: Behavior normal.        Thought Content: Thought content normal.        Judgment: Judgment normal.     Results for orders placed or performed in visit on 06/15/23  Comp Met (CMET)   Collection Time: 06/15/23 11:21 AM  Result Value Ref Range   Glucose 91 70 -  99 mg/dL   BUN 15 6 - 20 mg/dL   Creatinine, Ser 8.90 (H) 0.57 - 1.00 mg/dL   eGFR 75 >40 fO/fpw/8.26   BUN/Creatinine Ratio 14 9 - 23   Sodium 139 134 - 144 mmol/L   Potassium 4.5 3.5 - 5.2 mmol/L   Chloride 102 96 - 106 mmol/L   CO2 24 20 - 29 mmol/L   Calcium 9.9 8.7 - 10.2 mg/dL   Total Protein 6.7 6.0 - 8.5 g/dL   Albumin 4.5 4.0 - 5.0 g/dL   Globulin, Total 2.2 1.5 - 4.5 g/dL   Bilirubin Total 0.2 0.0 - 1.2 mg/dL   Alkaline Phosphatase 79 42 - 106 IU/L   AST 18 0 - 40 IU/L   ALT 9 0 - 32 IU/L  CBC w/Diff   Collection Time: 06/15/23 11:21 AM  Result Value Ref Range   WBC 7.6 3.4 - 10.8 x10E3/uL   RBC 4.63 3.77 - 5.28 x10E6/uL   Hemoglobin 13.8 11.1 - 15.9 g/dL   Hematocrit 58.7 65.9 - 46.6 %   MCV 89 79 - 97 fL   MCH 29.8 26.6 - 33.0 pg   MCHC 33.5 31.5 - 35.7 g/dL   RDW 86.8 88.2 - 84.5 %   Platelets 323 150 - 450 x10E3/uL   Neutrophils  58 Not Estab. %   Lymphs 33 Not Estab. %   Monocytes 6 Not Estab. %   Eos 2 Not Estab. %   Basos 1 Not Estab. %   Neutrophils Absolute 4.5 1.4 - 7.0 x10E3/uL   Lymphocytes Absolute 2.5 0.7 - 3.1 x10E3/uL   Monocytes Absolute 0.5 0.1 - 0.9 x10E3/uL   EOS (ABSOLUTE) 0.1 0.0 - 0.4 x10E3/uL   Basophils Absolute 0.0 0.0 - 0.2 x10E3/uL   Immature Granulocytes 0 Not Estab. %   Immature Grans (Abs) 0.0 0.0 - 0.1 x10E3/uL  B12   Collection Time: 06/15/23 11:21 AM  Result Value Ref Range   Vitamin B-12 409 232 - 1,245 pg/mL  Vitamin D  (25 hydroxy)   Collection Time: 06/15/23 11:21 AM  Result Value Ref Range   Vit D, 25-Hydroxy 26.3 (L) 30.0 - 100.0 ng/mL      Assessment & Plan:   Problem List Items Addressed This Visit       Other   Obesity (BMI 30-39.9) - Primary   Chronic.  Not well controlled.  Has not weighed herself.  Denies any side effects.  Will increase dose of Wegovy  to 1mg .  Follow up in 1 month.  Call sooner if concerns arise.      Relevant Medications   semaglutide -weight management (WEGOVY ) 1 MG/0.5ML SOAJ SQ  injection     Follow up plan: No follow-ups on file.   This visit was completed via MyChart due to the restrictions of the COVID-19 pandemic. All issues as above were discussed and addressed. Physical exam was done as above through visual confirmation on MyChart. If it was felt that the patient should be evaluated in the office, they were directed there. The patient verbally consented to this visit. Location of the patient: Home Location of the provider: Office Those involved with this call:  Provider: Darice Petty, NP CMA: Cena Maffucci, CMA Front Desk/Registration: Claretta Maiden This encounter was conducted via video.  I spent 30 minutes dedicated to the care of this patient on the date of this encounter to include previsit review of symptoms, plan of care and medications, face to face time with the patient, and post visit ordering of testing.

## 2024-09-26 NOTE — Assessment & Plan Note (Signed)
 Chronic.  Not well controlled.  Has not weighed herself.  Denies any side effects.  Will increase dose of Wegovy  to 1mg .  Follow up in 1 month.  Call sooner if concerns arise.

## 2024-10-04 ENCOUNTER — Ambulatory Visit: Admitting: Nurse Practitioner

## 2025-01-23 ENCOUNTER — Other Ambulatory Visit (HOSPITAL_COMMUNITY): Payer: Self-pay

## 2025-01-25 ENCOUNTER — Telehealth: Admitting: Nurse Practitioner

## 2025-01-25 VITALS — Ht 60.0 in | Wt 202.0 lb

## 2025-01-25 DIAGNOSIS — E669 Obesity, unspecified: Secondary | ICD-10-CM

## 2025-01-25 MED ORDER — WEGOVY 0.5 MG/0.5ML ~~LOC~~ SOAJ: 0.5000 mg | SUBCUTANEOUS | 2 refills | Status: AC

## 2025-01-25 MED ORDER — WEGOVY 0.25 MG/0.5ML ~~LOC~~ SOAJ: 0.2500 mg | SUBCUTANEOUS | 0 refills | Status: DC

## 2025-01-25 NOTE — Assessment & Plan Note (Signed)
 Chronic.  Not well controlled.  Patient has tried diet and exercise for weight loss without success.  Will start [facility] 0.25mg  weekly.  Will increase to [facility] 0.5mg  weekly after the first 4 weeks.  Discussed how to inject medication.  Discussed side effects and benefits of medication.  Follow up in 3 months.  Call sooner if concerns arise.

## 2025-01-25 NOTE — Progress Notes (Signed)
 Appointment has been made

## 2025-01-25 NOTE — Progress Notes (Addendum)
 "  Ht 5' (1.524 m)   Wt 202 lb (91.6 kg)   LMP 01/08/2025 (Exact Date)   BMI 39.45 kg/m    Subjective:    Patient ID: Joy Jackson, female    DOB: 03-26-2001, 24 y.o.   MRN: [id number]  HPI: Joy Jackson is a 24 y.o. female  Chief Complaint  Patient presents with   office visit    [employer] inquiry. Patient stated she noticed a bit of weight gain and insurance didn't cover wegovy . She has slight pain under her boob, it's aching.    WEIGHT GAIN Patient would like to go back on Wegovy .  She is exercising 3x per week.  Goal is to increase to 5x weekly.  She is currently working on a calorie deficit diet.   Duration: years Previous attempts at weight loss: yes Complications of obesity:  Peak weight: 202 lb Weight loss goal: 170lb Weight loss to date:  Requesting obesity pharmacotherapy: yes Current weight loss supplements/medications: yes Previous weight loss supplements/meds: yes- has been on Wegovy  Calories:   Clinical coverage for weight loss GLP's   Medication being dispensed is Wegovy  2 mL/28 day. Titration doses are 2 mL/28 days.   [x]  Product being prescribed is FDA approved for the indication, age, weight (if applicable) and not does not exceed dosing limits per the Prescribing Information per the clinical conditions for use.  [x]  Patient's baseline weight measured within the last 45 days as required by provider before dispensing.  [x]  Patient has participated in 6 months or greater of structured nutrition therapy and structured physical therapy as directed by their physician, unless physical therapy is contraindicated based on patient's medical history and will continue structured nutrition therapy and structured physical therapy while using any pharmacological interventions including GLP-1's.   [x]  Patient is new to therapy and One of the following:   []  The beneficiary is 22 years of age or over and has ONE of the following:  []  A BMI greater than or equal to  30 kg/m2  []  A BMI greater than or equal to 27 kg/m2 with at least one weight-related comorbidity/risk factor/complication (i.e. hypertension, type 2 diabetes, obstructive sleep apnea, cardiovascular disease, dyslipidemia)  If patient has one weight-related comorbidity/risk factor/complication (i.e. hypertension, type 2 diabetes, obstructive sleep apnea, cardiovascular disease, dyslipidemia), please list, Patient suffers from weight-related comorbidity/risk factor/complication depression    Last BMI/Weight/Height recorded .QONTJFA[88   .QONTJFA[85  BMI Readings from Last 1 Encounters:  01/25/25 39.45 kg/m        Relevant past medical, surgical, family and social history reviewed and updated as indicated. Interim medical history since our last visit reviewed. Allergies and medications reviewed and updated.  Review of Systems  Constitutional:  Positive for unexpected weight change.    Per HPI unless specifically indicated above     Objective:    Ht 5' (1.524 m)   Wt 202 lb (91.6 kg)   LMP 01/08/2025 (Exact Date)   BMI 39.45 kg/m   Wt Readings from Last 3 Encounters:  01/25/25 202 lb (91.6 kg)  08/03/24 199 lb (90.3 kg)  06/15/23 192 lb 12.8 oz (87.5 kg)    Physical Exam Vitals and nursing note reviewed.  HENT:     Head: Normocephalic.     Right Ear: Hearing normal.     Left Ear: Hearing normal.     Nose: Nose normal.  Eyes:     Pupils: Pupils are equal, round, and reactive to light.  Pulmonary:  Effort: Pulmonary effort is normal. No respiratory distress.  Neurological:     Mental Status: She is alert.  Psychiatric:        Mood and Affect: Mood normal.        Behavior: Behavior normal.        Thought Content: Thought content normal.        Judgment: Judgment normal.     Results for orders placed or performed in visit on 06/15/23  Comp Met (CMET)   Collection Time: 06/15/23 11:21 AM  Result Value Ref Range   Glucose 91 70 - 99 mg/dL   BUN 15 6 - 20  mg/dL   Creatinine, Ser 8.90 (H) 0.57 - 1.00 mg/dL   eGFR 75 >40 fO/fpw/8.26   BUN/Creatinine Ratio 14 9 - 23   Sodium 139 134 - 144 mmol/L   Potassium 4.5 3.5 - 5.2 mmol/L   Chloride 102 96 - 106 mmol/L   CO2 24 20 - 29 mmol/L   Calcium 9.9 8.7 - 10.2 mg/dL   Total Protein 6.7 6.0 - 8.5 g/dL   Albumin 4.5 4.0 - 5.0 g/dL   Globulin, Total 2.2 1.5 - 4.5 g/dL   Bilirubin Total 0.2 0.0 - 1.2 mg/dL   Alkaline Phosphatase 79 42 - 106 IU/L   AST 18 0 - 40 IU/L   ALT 9 0 - 32 IU/L  CBC w/Diff   Collection Time: 06/15/23 11:21 AM  Result Value Ref Range   WBC 7.6 3.4 - 10.8 x10E3/uL   RBC 4.63 3.77 - 5.28 x10E6/uL   Hemoglobin 13.8 11.1 - 15.9 g/dL   Hematocrit 58.7 65.9 - 46.6 %   MCV 89 79 - 97 fL   MCH 29.8 26.6 - 33.0 pg   MCHC 33.5 31.5 - 35.7 g/dL   RDW 86.8 88.2 - 84.5 %   Platelets 323 150 - 450 x10E3/uL   Neutrophils 58 Not Estab. %   Lymphs 33 Not Estab. %   Monocytes 6 Not Estab. %   Eos 2 Not Estab. %   Basos 1 Not Estab. %   Neutrophils Absolute 4.5 1.4 - 7.0 x10E3/uL   Lymphocytes Absolute 2.5 0.7 - 3.1 x10E3/uL   Monocytes Absolute 0.5 0.1 - 0.9 x10E3/uL   EOS (ABSOLUTE) 0.1 0.0 - 0.4 x10E3/uL   Basophils Absolute 0.0 0.0 - 0.2 x10E3/uL   Immature Granulocytes 0 Not Estab. %   Immature Grans (Abs) 0.0 0.0 - 0.1 x10E3/uL  B12   Collection Time: 06/15/23 11:21 AM  Result Value Ref Range   Vitamin B-12 409 232 - 1,245 pg/mL  Vitamin D  (25 hydroxy)   Collection Time: 06/15/23 11:21 AM  Result Value Ref Range   Vit D, 25-Hydroxy 26.3 (L) 30.0 - 100.0 ng/mL      Assessment & Plan:   Problem List Items Addressed This Visit       Other   Obesity (BMI 30-39.9) - Primary   Chronic.  Not well controlled.  Patient has tried diet and exercise for weight loss without success.  Will start Wegovy  0.25mg  weekly.  Will increase to Wegovy  0.5mg  weekly after the first 4 weeks.  Discussed how to inject medication.  Discussed side effects and benefits of medication.  Follow  up in 3 months.  Call sooner if concerns arise.         Relevant Medications   semaglutide -weight management (WEGOVY ) 0.5 MG/0.5ML SOAJ SQ injection     Follow up plan: Return in about 3 months (around 04/25/2025)  for Weight Managment (virtual).   This visit was completed via MyChart due to the restrictions of the COVID-19 pandemic. All issues as above were discussed and addressed. Physical exam was done as above through visual confirmation on MyChart. If it was felt that the patient should be evaluated in the office, they were directed there. The patient verbally consented to this visit. Location of the patient: Home Location of the provider: Office Those involved with this call:  Provider: Darice Petty, NP CMA: Joya Louder, CMA Front Desk/Registration: Claretta Maiden This encounter was conducted via video.  I spent 30 minutes dedicated to the care of this patient on the date of this encounter to include previsit review of symptoms, plan of care, follow up, face to face time with the patient, and post visit ordering of testing.      "

## 2025-01-26 ENCOUNTER — Encounter: Payer: Self-pay | Admitting: Nurse Practitioner

## 2025-01-27 ENCOUNTER — Other Ambulatory Visit: Payer: Self-pay

## 2025-01-27 NOTE — Telephone Encounter (Unsigned)
 Copied from CRM [id number]. Topic: General - Other >> Jan 27, 2025  8:21 AM Avram MATSU wrote: Reason for CRM: patient is returning the call to provider her height and weight. Please advise [phone]  5'54ft last time she weight it was 202

## 2025-01-31 NOTE — Telephone Encounter (Signed)
 Documented. Pending provider updating note with additional needed information.

## 2025-01-31 NOTE — Telephone Encounter (Unsigned)
 Copied from CRM [id number]. Topic: General - Other >> Jan 27, 2025  8:21 AM Avram MATSU wrote: Reason for CRM: patient is returning the call to provider her height and weight. Please advise [phone]  5'61ft last time she weight it was 202 >> Jan 30, 2025  2:50 PM Emylou G wrote: Adv patient of turn time - but would like call back when shara goes through

## 2025-02-01 ENCOUNTER — Other Ambulatory Visit (HOSPITAL_COMMUNITY): Payer: Self-pay

## 2025-02-01 ENCOUNTER — Ambulatory Visit: Admitting: Nurse Practitioner

## 2025-02-01 NOTE — Progress Notes (Signed)
 Height: 5' (152.4 cm)  Weight: 202 lb (91.6 kg)  BMI Readings from Last 1 Encounters:  01/25/25 39.45 kg/m

## 2025-02-02 ENCOUNTER — Telehealth: Payer: Self-pay

## 2025-02-02 ENCOUNTER — Other Ambulatory Visit (HOSPITAL_COMMUNITY): Payer: Self-pay

## 2025-02-02 NOTE — Telephone Encounter (Signed)
 Pharmacy Patient Advocate Encounter   Received notification from RX Request Messages that prior authorization for Wegovy  0.5mg /0.38ml is required/requested.   Insurance verification completed.   The patient is insured through [employer].   Per test claim: The current 28 day co-pay is, $4.  No PA needed at this time. This test claim was processed through [facility]- copay amounts may vary at other pharmacies due to pharmacy/plan contracts, or as the patient moves through the different stages of their insurance plan.

## 2025-02-03 ENCOUNTER — Other Ambulatory Visit (HOSPITAL_COMMUNITY): Payer: Self-pay

## 2025-04-25 ENCOUNTER — Ambulatory Visit: Admitting: Nurse Practitioner
